# Patient Record
Sex: Female | Born: 1955 | Race: White | Hispanic: No | Marital: Married | Smoking: Never smoker
Health system: Southern US, Community
[De-identification: ages and names within clinical notes are randomized; demographics above are authoritative.]

## PROBLEM LIST (undated history)

## (undated) DIAGNOSIS — E785 Hyperlipidemia, unspecified: Secondary | ICD-10-CM

## (undated) DIAGNOSIS — C4492 Squamous cell carcinoma of skin, unspecified: Secondary | ICD-10-CM

## (undated) DIAGNOSIS — M810 Age-related osteoporosis without current pathological fracture: Secondary | ICD-10-CM

## (undated) HISTORY — DX: Hyperlipidemia, unspecified: E78.5

## (undated) HISTORY — DX: Age-related osteoporosis without current pathological fracture: M81.0

## (undated) HISTORY — PX: LAPAROSCOPY: SHX197

---
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
## 1982-05-11 DIAGNOSIS — F32A Depression, unspecified: Secondary | ICD-10-CM

## 1982-05-11 DIAGNOSIS — F419 Anxiety disorder, unspecified: Secondary | ICD-10-CM

## 1982-05-11 HISTORY — DX: Anxiety disorder, unspecified: F41.9

## 1982-05-11 HISTORY — DX: Depression, unspecified: F32.A

## 1983-09-20 DIAGNOSIS — J45909 Unspecified asthma, uncomplicated: Secondary | ICD-10-CM

## 1983-09-20 HISTORY — DX: Unspecified asthma, uncomplicated: J45.909

## 2004-09-18 ENCOUNTER — Ambulatory Visit: Payer: Self-pay | Admitting: Unknown Physician Specialty

## 2005-07-22 ENCOUNTER — Ambulatory Visit: Payer: Self-pay | Admitting: Unknown Physician Specialty

## 2006-08-02 ENCOUNTER — Ambulatory Visit: Payer: Self-pay | Admitting: Unknown Physician Specialty

## 2006-10-11 HISTORY — PX: BREAST BIOPSY: SHX20

## 2007-07-14 ENCOUNTER — Ambulatory Visit: Payer: Self-pay | Admitting: Unknown Physician Specialty

## 2007-09-27 ENCOUNTER — Ambulatory Visit: Payer: Self-pay | Admitting: Unknown Physician Specialty

## 2008-11-05 ENCOUNTER — Ambulatory Visit: Payer: Self-pay | Admitting: Unknown Physician Specialty

## 2008-11-14 ENCOUNTER — Ambulatory Visit: Payer: Self-pay | Admitting: Unknown Physician Specialty

## 2009-11-19 ENCOUNTER — Ambulatory Visit: Payer: Self-pay | Admitting: Unknown Physician Specialty

## 2010-08-19 ENCOUNTER — Ambulatory Visit: Payer: Self-pay | Admitting: Unknown Physician Specialty

## 2010-11-26 ENCOUNTER — Ambulatory Visit: Payer: Self-pay | Admitting: Unknown Physician Specialty

## 2011-05-12 ENCOUNTER — Ambulatory Visit: Payer: Self-pay | Admitting: Unknown Physician Specialty

## 2011-11-30 ENCOUNTER — Ambulatory Visit: Payer: Self-pay | Admitting: Internal Medicine

## 2012-11-30 ENCOUNTER — Ambulatory Visit: Payer: Self-pay | Admitting: Internal Medicine

## 2013-05-09 ENCOUNTER — Ambulatory Visit: Payer: Self-pay | Admitting: Family Medicine

## 2013-12-04 ENCOUNTER — Ambulatory Visit: Payer: Self-pay | Admitting: Internal Medicine

## 2014-04-18 ENCOUNTER — Ambulatory Visit: Payer: Self-pay | Admitting: Emergency Medicine

## 2014-08-11 HISTORY — PX: COLONOSCOPY: SHX174

## 2014-08-11 LAB — HM COLONOSCOPY

## 2014-09-04 ENCOUNTER — Ambulatory Visit: Payer: Self-pay | Admitting: Unknown Physician Specialty

## 2014-11-18 LAB — CBC AND DIFFERENTIAL: Hemoglobin: 15 g/dL (ref 12.0–16.0)

## 2014-11-18 LAB — TSH: TSH: 1.05 u[IU]/mL (ref <=5.90)

## 2014-11-18 LAB — LIPID PANEL
Cholesterol: 237 mg/dL — AB (ref 0–200)
HDL: 74 mg/dL — AB (ref 35–70)
LDL Cholesterol: 139 mg/dL
Triglycerides: 121 mg/dL (ref 40–160)

## 2014-11-18 LAB — BASIC METABOLIC PANEL
BUN: 12 mg/dL (ref 4–21)
Creatinine: 0.9 mg/dL (ref <=1.1)

## 2014-11-18 LAB — HM PAP SMEAR: HM Pap smear: NEGATIVE

## 2014-12-06 ENCOUNTER — Ambulatory Visit: Payer: Self-pay | Admitting: Internal Medicine

## 2014-12-10 ENCOUNTER — Ambulatory Visit: Payer: Self-pay | Admitting: Internal Medicine

## 2014-12-10 LAB — HM MAMMOGRAPHY

## 2015-02-03 LAB — SURGICAL PATHOLOGY

## 2015-08-11 ENCOUNTER — Encounter: Payer: Self-pay | Admitting: Internal Medicine

## 2015-08-11 ENCOUNTER — Other Ambulatory Visit: Payer: Self-pay | Admitting: Internal Medicine

## 2015-08-11 DIAGNOSIS — M810 Age-related osteoporosis without current pathological fracture: Secondary | ICD-10-CM | POA: Insufficient documentation

## 2015-08-11 DIAGNOSIS — F5101 Primary insomnia: Secondary | ICD-10-CM | POA: Insufficient documentation

## 2015-08-11 DIAGNOSIS — F324 Major depressive disorder, single episode, in partial remission: Secondary | ICD-10-CM | POA: Insufficient documentation

## 2015-08-11 DIAGNOSIS — F339 Major depressive disorder, recurrent, unspecified: Secondary | ICD-10-CM | POA: Insufficient documentation

## 2015-08-11 DIAGNOSIS — E559 Vitamin D deficiency, unspecified: Secondary | ICD-10-CM | POA: Insufficient documentation

## 2015-08-11 DIAGNOSIS — A6000 Herpesviral infection of urogenital system, unspecified: Secondary | ICD-10-CM | POA: Insufficient documentation

## 2015-08-11 DIAGNOSIS — J4520 Mild intermittent asthma, uncomplicated: Secondary | ICD-10-CM | POA: Insufficient documentation

## 2015-08-11 DIAGNOSIS — T7840XA Allergy, unspecified, initial encounter: Secondary | ICD-10-CM | POA: Insufficient documentation

## 2015-09-26 ENCOUNTER — Other Ambulatory Visit: Payer: Self-pay

## 2015-09-26 MED ORDER — FLUOXETINE HCL 40 MG PO CAPS: 40.0000 mg | ORAL_CAPSULE | Freq: Every day | ORAL | Status: DC

## 2015-10-10 NOTE — Telephone Encounter (Signed)
pts coming in on 3/29 for cpe

## 2015-11-19 ENCOUNTER — Other Ambulatory Visit: Payer: Self-pay | Admitting: Internal Medicine

## 2015-11-24 ENCOUNTER — Other Ambulatory Visit: Payer: Self-pay | Admitting: Internal Medicine

## 2016-01-07 ENCOUNTER — Encounter: Payer: Self-pay | Admitting: Internal Medicine

## 2016-01-07 ENCOUNTER — Ambulatory Visit (INDEPENDENT_AMBULATORY_CARE_PROVIDER_SITE_OTHER): Payer: 59 | Admitting: Internal Medicine

## 2016-01-07 VITALS — BP 110/72 | HR 80 | Ht 64.0 in | Wt 145.0 lb

## 2016-01-07 DIAGNOSIS — Z1239 Encounter for other screening for malignant neoplasm of breast: Secondary | ICD-10-CM | POA: Diagnosis not present

## 2016-01-07 DIAGNOSIS — Z0000 Encounter for general adult medical examination without abnormal findings: Secondary | ICD-10-CM | POA: Diagnosis not present

## 2016-01-07 DIAGNOSIS — J4520 Mild intermittent asthma, uncomplicated: Secondary | ICD-10-CM

## 2016-01-07 DIAGNOSIS — F5101 Primary insomnia: Secondary | ICD-10-CM

## 2016-01-07 DIAGNOSIS — M810 Age-related osteoporosis without current pathological fracture: Secondary | ICD-10-CM

## 2016-01-07 DIAGNOSIS — F339 Major depressive disorder, recurrent, unspecified: Secondary | ICD-10-CM

## 2016-01-07 LAB — POC URINALYSIS WITH MICROSCOPIC (NON AUTO)MANUAL RESULT
Bilirubin, UA: NEGATIVE
Blood, UA: NEGATIVE
Crystals: 0
Epithelial cells, urine per micros: 5
Glucose, UA: NEGATIVE
Ketones, UA: NEGATIVE
Mucus, UA: 0
Nitrite, UA: NEGATIVE
Protein, UA: NEGATIVE
RBC: 0 M/uL — AB (ref 4.04–5.48)
Spec Grav, UA: 1.025
Urobilinogen, UA: 0.2
WBC Casts, UA: 0
pH, UA: 6.5

## 2016-01-07 NOTE — Patient Instructions (Signed)
Breast Self-Awareness Practicing breast self-awareness may pick up problems early, prevent significant medical complications, and possibly save your life. By practicing breast self-awareness, you can become familiar with how your breasts look and feel and if your breasts are changing. This allows you to notice changes early. It can also offer you some reassurance that your breast health is good. One way to learn what is normal for your breasts and whether your breasts are changing is to do a breast self-exam. If you find a lump or something that was not present in the past, it is best to contact your caregiver right away. Other findings that should be evaluated by your caregiver include nipple discharge, especially if it is bloody; skin changes or reddening; areas where the skin seems to be pulled in (retracted); or new lumps and bumps. Breast pain is seldom associated with cancer (malignancy), but should also be evaluated by a caregiver. HOW TO PERFORM A BREAST SELF-EXAM The best time to examine your breasts is 5-7 days after your menstrual period is over. During menstruation, the breasts are lumpier, and it may be more difficult to pick up changes. If you do not menstruate, have reached menopause, or had your uterus removed (hysterectomy), you should examine your breasts at regular intervals, such as monthly. If you are breastfeeding, examine your breasts after a feeding or after using a breast pump. Breast implants do not decrease the risk for lumps or tumors, so continue to perform breast self-exams as recommended. Talk to your caregiver about how to determine the difference between the implant and breast tissue. Also, talk about the amount of pressure you should use during the exam. Over time, you will become more familiar with the variations of your breasts and more comfortable with the exam. A breast self-exam requires you to remove all your clothes above the waist. 1. Look at your breasts and nipples.  Stand in front of a mirror in a room with good lighting. With your hands on your hips, push your hands firmly downward. Look for a difference in shape, contour, and size from one breast to the other (asymmetry). Asymmetry includes puckers, dips, or bumps. Also, look for skin changes, such as reddened or scaly areas on the breasts. Look for nipple changes, such as discharge, dimpling, repositioning, or redness. 2. Carefully feel your breasts. This is best done either in the shower or tub while using soapy water or when flat on your back. Place the arm (on the side of the breast you are examining) above your head. Use the pads (not the fingertips) of your three middle fingers on your opposite hand to feel your breasts. Start in the underarm area and use  inch (2 cm) overlapping circles to feel your breast. Use 3 different levels of pressure (light, medium, and firm pressure) at each circle before moving to the next circle. The light pressure is needed to feel the tissue closest to the skin. The medium pressure will help to feel breast tissue a little deeper, while the firm pressure is needed to feel the tissue close to the ribs. Continue the overlapping circles, moving downward over the breast until you feel your ribs below your breast. Then, move one finger-width towards the center of the body. Continue to use the  inch (2 cm) overlapping circles to feel your breast as you move slowly up toward the collar bone (clavicle) near the base of the neck. Continue the up and down exam using all 3 pressures until you reach the   middle of the chest. Do this with each breast, carefully feeling for lumps or changes. 3.  Keep a written record with breast changes or normal findings for each breast. By writing this information down, you do not need to depend only on memory for size, tenderness, or location. Write down where you are in your menstrual cycle, if you are still menstruating. Breast tissue can have some lumps or  thick tissue. However, see your caregiver if you find anything that concerns you.  SEEK MEDICAL CARE IF:  You see a change in shape, contour, or size of your breasts or nipples.   You see skin changes, such as reddened or scaly areas on the breasts or nipples.   You have an unusual discharge from your nipples.   You feel a new lump or unusually thick areas.    This information is not intended to replace advice given to you by your health care provider. Make sure you discuss any questions you have with your health care provider.   Document Released: 09/27/2005 Document Revised: 09/13/2012 Document Reviewed: 01/12/2012 Elsevier Interactive Patient Education 2016 [employer]  

## 2016-01-07 NOTE — Progress Notes (Signed)
Date:  01/07/2016   Name:  REIGHLYNN DEFREECE   DOB:  23-Oct-1955   MRN:  [id number]   Chief Complaint: Annual Exam Sherlynn Stalls Reola Mosher is a 60 y.o. female who presents today for her Complete Annual Exam. She feels well. She reports exercising none. She reports she is sleeping well. She is still working full time and handles all of the household chores since her husband has Parkinson's disease and mild dementia.    Asthma She complains of chest tightness and wheezing. There is no shortness of breath. Chronicity: very rare symptoms - uses albuterol less than once per month. The current episode started more than 1 year ago. The problem occurs rarely. The problem has been unchanged. Pertinent negatives include no chest pain, fever, headaches or trouble swallowing. Her symptoms are aggravated by animal exposure and pollen. Her symptoms are not alleviated by beta-agonist.  Depression        This is a chronic problem.  The current episode started more than 1 year ago.   The problem occurs intermittently.  The problem has been resolved since onset.  Associated symptoms include no decreased concentration, no fatigue and no headaches.  Past treatments include SSRIs - Selective serotonin reuptake inhibitors.  Compliance with treatment is good.  Previous treatment provided significant relief.    Review of Systems  Constitutional: Negative for fever, chills and fatigue.  HENT: Negative for hearing loss and trouble swallowing.   Eyes: Negative for visual disturbance.  Respiratory: Positive for wheezing. Negative for chest tightness and shortness of breath.   Cardiovascular: Negative for chest pain, palpitations and leg swelling.  Gastrointestinal: Negative for nausea, vomiting, abdominal pain, diarrhea and blood in stool.  Genitourinary: Negative for dysuria, urgency, hematuria, vaginal bleeding and vaginal discharge.  Musculoskeletal: Negative for joint swelling and arthralgias.  Skin: Negative for rash.    Neurological: Negative for dizziness, tremors, syncope, weakness and headaches.  Hematological: Negative for adenopathy.  Psychiatric/Behavioral: Positive for depression. Negative for sleep disturbance, dysphoric mood and decreased concentration.    Patient Active Problem List   Diagnosis Date Noted  . Asthma, mild intermittent 08/11/2015  . Allergic state 08/11/2015  . Genital herpes simplex type 2 08/11/2015  . Chronic recurrent major depressive disorder ([place]) 08/11/2015  . OP (osteoporosis) 08/11/2015  . Idiopathic insomnia 08/11/2015  . Avitaminosis D 08/11/2015    Prior to Admission medications   Medication Sig Start Date End Date Taking? Authorizing Provider  acyclovir ointment (ZOVIRAX) 5 % ZOVIRAX, 5% (External Ointment)  1 Ointment PRN for 30 days  Quantity: 15;  Refills: 2   Ordered :18-Nov-2014  Halina Maidens M.D.;  Started 18-Nov-2014 Active 11/18/14  Yes Historical Provider, MD  cetirizine (ZYRTEC) 10 MG tablet Take 10 mg by mouth daily.   Yes Historical Provider, MD  FLUoxetine (PROZAC) 40 MG capsule Take 1 capsule (40 mg total) by mouth daily. 09/26/15  Yes Glean Hess, MD  PROAIR HFA 108 [phone] Base) MCG/ACT inhaler INHALE TWO PUFFS FOUR TIMES DAILY 11/24/15  Yes Glean Hess, MD  traZODone (DESYREL) 50 MG tablet TAKE ONE (1) TABLET BY MOUTH EVERY DAY 11/19/15  Yes Glean Hess, MD    No Known Allergies  Past Surgical History  Procedure Laterality Date  . Laparoscopy      endometriosis    Social History  Substance Use Topics  . Smoking status: Never Smoker   . Smokeless tobacco: None  . Alcohol Use: 1.2 oz/week    2 Standard drinks  or equivalent per week     Comment: occasional    Medication list has been reviewed and updated.   Physical Exam  Constitutional: She is oriented to person, place, and time. She appears well-developed and well-nourished. No distress.  HENT:  Head: Normocephalic and atraumatic.  Right Ear: Tympanic membrane and  ear canal normal.  Left Ear: Tympanic membrane and ear canal normal.  Nose: Right sinus exhibits no maxillary sinus tenderness. Left sinus exhibits no maxillary sinus tenderness.  Mouth/Throat: Uvula is midline and oropharynx is clear and moist.  Eyes: Conjunctivae and EOM are normal. Right eye exhibits no discharge. Left eye exhibits no discharge. No scleral icterus.  Neck: Normal range of motion. Carotid bruit is not present. No erythema present. No thyromegaly present.  Cardiovascular: Normal rate, regular rhythm, normal heart sounds and normal pulses.   Pulmonary/Chest: Effort normal. No respiratory distress. She has no wheezes. Right breast exhibits no mass, no nipple discharge, no skin change and no tenderness. Left breast exhibits no mass, no nipple discharge, no skin change and no tenderness.  Abdominal: Soft. Bowel sounds are normal. There is no hepatosplenomegaly. There is no tenderness. There is no CVA tenderness.  Musculoskeletal: Normal range of motion.  Lymphadenopathy:    She has no cervical adenopathy.    She has no axillary adenopathy.  Neurological: She is alert and oriented to person, place, and time. She has normal reflexes. No cranial nerve deficit or sensory deficit.  Skin: Skin is warm, dry and intact. No rash noted.  Psychiatric: She has a normal mood and affect. Her speech is normal and behavior is normal. Thought content normal.  Nursing note and vitals reviewed.   BP 110/72 mmHg  Pulse 80  Ht 5\' 4"  (1.626 m)  Wt 145 lb (65.772 kg)  BMI 24.88 kg/m2  Assessment and Plan: 1. Annual physical exam Encourage regular exercise for stress reduction and bone health - CBC with Differential/Platelet - Comprehensive metabolic panel - Lipid panel  2. Chronic recurrent major depressive disorder ([place]) Doing well on medication - TSH  3. Asthma, mild intermittent, uncomplicated Recommend [id number] [none available today] but will give next visit  4. OP  (osteoporosis) Begin regular exercise and take calcium +D daily - DG Bone Density; Future  5. Idiopathic insomnia Controlled with trazodone  6. Breast cancer screening - MM DIGITAL SCREENING BILATERAL; Future   Halina Maidens, MD [facility]  01/07/2016

## 2016-01-08 ENCOUNTER — Telehealth: Payer: Self-pay

## 2016-01-08 LAB — COMPREHENSIVE METABOLIC PANEL
ALT: 30 IU/L (ref 0–32)
AST: 29 IU/L (ref 0–40)
Albumin/Globulin Ratio: 2.2 (ref 1.2–2.2)
Albumin: 4.8 g/dL (ref 3.6–4.8)
Alkaline Phosphatase: 56 IU/L (ref 39–117)
BUN/Creatinine Ratio: 13 (ref 11–26)
BUN: 11 mg/dL (ref 8–27)
Bilirubin Total: 0.4 mg/dL (ref 0.0–1.2)
CO2: 23 mmol/L (ref 18–29)
Calcium: 9.2 mg/dL (ref 8.7–10.3)
Chloride: 100 mmol/L (ref 96–106)
Creatinine, Ser: 0.87 mg/dL (ref 0.57–1.00)
GFR calc Af Amer: 83 mL/min/{1.73_m2} (ref >59)
GFR calc non Af Amer: 72 mL/min/{1.73_m2} (ref >59)
Globulin, Total: 2.2 g/dL (ref 1.5–4.5)
Glucose: 76 mg/dL (ref 65–99)
Potassium: 4.5 mmol/L (ref 3.5–5.2)
Sodium: 142 mmol/L (ref 134–144)
Total Protein: 7 g/dL (ref 6.0–8.5)

## 2016-01-08 LAB — LIPID PANEL
Chol/HDL Ratio: 3.4 ratio units (ref 0.0–4.4)
Cholesterol, Total: 238 mg/dL — ABNORMAL HIGH (ref 100–199)
HDL: 70 mg/dL (ref >39)
LDL Calculated: 141 mg/dL — ABNORMAL HIGH (ref 0–99)
Triglycerides: 136 mg/dL (ref 0–149)
VLDL Cholesterol Cal: 27 mg/dL (ref 5–40)

## 2016-01-08 LAB — CBC WITH DIFFERENTIAL/PLATELET
Basophils Absolute: 0 10*3/uL (ref 0.0–0.2)
Basos: 0 %
EOS (ABSOLUTE): 0.1 10*3/uL (ref 0.0–0.4)
Eos: 2 %
Hematocrit: 43.7 % (ref 34.0–46.6)
Hemoglobin: 14.7 g/dL (ref 11.1–15.9)
Immature Grans (Abs): 0 10*3/uL (ref 0.0–0.1)
Immature Granulocytes: 0 %
Lymphocytes Absolute: 1.9 10*3/uL (ref 0.7–3.1)
Lymphs: 29 %
MCH: 29.5 pg (ref 26.6–33.0)
MCHC: 33.6 g/dL (ref 31.5–35.7)
MCV: 88 fL (ref 79–97)
Monocytes Absolute: 0.5 10*3/uL (ref 0.1–0.9)
Monocytes: 8 %
Neutrophils Absolute: 3.9 10*3/uL (ref 1.4–7.0)
Neutrophils: 61 %
Platelets: 282 10*3/uL (ref 150–379)
RBC: 4.99 x10E6/uL (ref 3.77–5.28)
RDW: 14.4 % (ref 12.3–15.4)
WBC: 6.4 10*3/uL (ref 3.4–10.8)

## 2016-01-08 LAB — TSH: TSH: 0.986 u[IU]/mL (ref 0.450–4.500)

## 2016-01-08 NOTE — Telephone Encounter (Signed)
Spoke with patient. Patient advised of all results and verbalized understanding. Will call back with any future questions or concerns. MAH  

## 2016-01-08 NOTE — Telephone Encounter (Signed)
-----   Message from Glean Hess, MD sent at 01/08/2016  7:49 AM EDT ----- Labs are normal.

## 2016-01-08 NOTE — Telephone Encounter (Signed)
Left message for patient to call back  

## 2016-01-27 ENCOUNTER — Ambulatory Visit
Admission: RE | Admit: 2016-01-27 | Discharge: 2016-01-27 | Disposition: A | Discharge status: Home / Self Care | Payer: 59 | Source: Ambulatory Visit | Attending: Internal Medicine | Admitting: Internal Medicine

## 2016-01-27 ENCOUNTER — Telehealth: Payer: Self-pay

## 2016-01-27 DIAGNOSIS — M8580 Other specified disorders of bone density and structure, unspecified site: Secondary | ICD-10-CM | POA: Diagnosis not present

## 2016-01-27 DIAGNOSIS — Z1231 Encounter for screening mammogram for malignant neoplasm of breast: Secondary | ICD-10-CM | POA: Diagnosis present

## 2016-01-27 DIAGNOSIS — M810 Age-related osteoporosis without current pathological fracture: Secondary | ICD-10-CM

## 2016-01-27 DIAGNOSIS — Z1239 Encounter for other screening for malignant neoplasm of breast: Secondary | ICD-10-CM

## 2016-01-27 NOTE — Telephone Encounter (Signed)
-----   Message from Glean Hess, MD sent at 01/27/2016 10:14 AM EDT ----- Bone density shows normal spine (unchanged from 2015).  Hip is osteopenia - slightly worse from 2015 but still not osteoporosis.  Take Calcium 1200 mg per day and Vitamin D 800 IU per day and daily weight bearing exercise.

## 2016-01-27 NOTE — Telephone Encounter (Signed)
Left message for patient to call back  

## 2016-01-29 NOTE — Telephone Encounter (Signed)
Spoke with patient. Patient advised of all results and verbalized understanding. Will call back with any future questions or concerns. MAH  

## 2016-02-19 ENCOUNTER — Other Ambulatory Visit: Payer: Self-pay | Admitting: Internal Medicine

## 2016-05-18 ENCOUNTER — Other Ambulatory Visit: Payer: Self-pay | Admitting: Internal Medicine

## 2016-11-01 DIAGNOSIS — D2312 Other benign neoplasm of skin of left eyelid, including canthus: Secondary | ICD-10-CM | POA: Diagnosis not present

## 2016-11-02 DIAGNOSIS — Z86018 Personal history of other benign neoplasm: Secondary | ICD-10-CM | POA: Diagnosis not present

## 2016-11-02 DIAGNOSIS — L821 Other seborrheic keratosis: Secondary | ICD-10-CM | POA: Diagnosis not present

## 2016-11-12 ENCOUNTER — Other Ambulatory Visit: Payer: Self-pay | Admitting: Internal Medicine

## 2016-11-15 NOTE — Telephone Encounter (Signed)
pts coming in on 6/25 for cpe

## 2016-11-25 DIAGNOSIS — H029 Unspecified disorder of eyelid: Secondary | ICD-10-CM | POA: Diagnosis not present

## 2016-12-21 ENCOUNTER — Other Ambulatory Visit: Payer: Self-pay | Admitting: Internal Medicine

## 2016-12-21 DIAGNOSIS — Z1231 Encounter for screening mammogram for malignant neoplasm of breast: Secondary | ICD-10-CM

## 2016-12-23 DIAGNOSIS — D2312 Other benign neoplasm of skin of left eyelid, including canthus: Secondary | ICD-10-CM | POA: Diagnosis not present

## 2016-12-23 DIAGNOSIS — H029 Unspecified disorder of eyelid: Secondary | ICD-10-CM | POA: Diagnosis not present

## 2017-01-27 ENCOUNTER — Ambulatory Visit
Admission: RE | Admit: 2017-01-27 | Discharge: 2017-01-27 | Disposition: A | Discharge status: Home / Self Care | Payer: 59 | Source: Ambulatory Visit | Attending: Internal Medicine | Admitting: Internal Medicine

## 2017-01-27 DIAGNOSIS — Z1231 Encounter for screening mammogram for malignant neoplasm of breast: Secondary | ICD-10-CM | POA: Diagnosis not present

## 2017-01-28 ENCOUNTER — Other Ambulatory Visit: Payer: Self-pay

## 2017-01-28 MED ORDER — FLUOXETINE HCL 40 MG PO CAPS: 40.0000 mg | ORAL_CAPSULE | Freq: Every day | ORAL | 3 refills | Status: DC

## 2017-01-31 ENCOUNTER — Other Ambulatory Visit: Payer: Self-pay | Admitting: Internal Medicine

## 2017-01-31 MED ORDER — FLUOXETINE HCL 40 MG PO CAPS: 40.0000 mg | ORAL_CAPSULE | Freq: Every day | ORAL | 3 refills | Status: DC

## 2017-04-04 ENCOUNTER — Ambulatory Visit (INDEPENDENT_AMBULATORY_CARE_PROVIDER_SITE_OTHER): Payer: 59 | Admitting: Internal Medicine

## 2017-04-04 ENCOUNTER — Encounter: Payer: Self-pay | Admitting: Internal Medicine

## 2017-04-04 VITALS — BP 122/66 | HR 76 | Ht 64.0 in | Wt 151.0 lb

## 2017-04-04 DIAGNOSIS — F339 Major depressive disorder, recurrent, unspecified: Secondary | ICD-10-CM

## 2017-04-04 DIAGNOSIS — J4520 Mild intermittent asthma, uncomplicated: Secondary | ICD-10-CM | POA: Diagnosis not present

## 2017-04-04 DIAGNOSIS — Z0000 Encounter for general adult medical examination without abnormal findings: Secondary | ICD-10-CM

## 2017-04-04 DIAGNOSIS — F5101 Primary insomnia: Secondary | ICD-10-CM

## 2017-04-04 LAB — POCT URINALYSIS DIPSTICK
Bilirubin, UA: NEGATIVE
Blood, UA: NEGATIVE
Glucose, UA: NEGATIVE
Ketones, UA: NEGATIVE
Leukocytes, UA: NEGATIVE
Nitrite, UA: NEGATIVE
Protein, UA: NEGATIVE
Spec Grav, UA: 1.015 (ref 1.010–1.025)
Urobilinogen, UA: 0.2 E.U./dL
pH, UA: 5 (ref 5.0–8.0)

## 2017-04-04 MED ORDER — TRAZODONE HCL 50 MG PO TABS: 50.0000 mg | ORAL_TABLET | Freq: Every day | ORAL | 1 refills | Status: DC

## 2017-04-04 NOTE — Progress Notes (Signed)
Date:  04/04/2017   Name:  Janet Cook   DOB:  Dec 09, 1955   MRN:  [id number]   Chief Complaint: Annual Exam (Breast Exam. No pap- last was 2016 with Neg HPV) Janet Cook is a 61 y.o. female who presents today for her Complete Annual Exam. She feels fairly well. She reports exercising some. She reports she is sleeping fairly well taking Trazodone nightly. Last pap done in 2016 - normal with negative HPV.  Mammogram done in April.  Her husband's parkinsonism is getting worse - she is still working for now but there are times when she gets little sleep. Her mood is stable on Prozac. She is exercising some but has gained about 5 lbs.   Review of Systems  Constitutional: Negative for chills, fatigue and fever.  HENT: Negative for congestion, hearing loss, tinnitus, trouble swallowing and voice change.   Eyes: Negative for visual disturbance.  Respiratory: Negative for cough, chest tightness, shortness of breath and wheezing.   Cardiovascular: Negative for chest pain, palpitations and leg swelling.  Gastrointestinal: Negative for abdominal pain, constipation, diarrhea and vomiting.  Endocrine: Negative for polydipsia and polyuria.  Genitourinary: Negative for dysuria, frequency, genital sores, vaginal bleeding and vaginal discharge.  Musculoskeletal: Negative for arthralgias, gait problem and joint swelling.  Skin: Negative for color change and rash.  Neurological: Negative for dizziness, tremors, light-headedness and headaches.  Hematological: Negative for adenopathy. Does not bruise/bleed easily.  Psychiatric/Behavioral: Negative for dysphoric mood and sleep disturbance. The patient is not nervous/anxious.     Patient Active Problem List   Diagnosis Date Noted  . Asthma, mild intermittent 08/11/2015  . Allergic state 08/11/2015  . Genital herpes simplex type 2 08/11/2015  . Chronic recurrent major depressive disorder ([place]) 08/11/2015  . OP (osteoporosis) 08/11/2015  .  Idiopathic insomnia 08/11/2015  . Avitaminosis D 08/11/2015    Prior to Admission medications   Medication Sig Start Date End Date Taking? Authorizing Provider  acyclovir ointment (ZOVIRAX) 5 % APPLY TO AFFECTED AREA AS NEEDED 05/18/16  Yes Glean Hess, MD  cetirizine (ZYRTEC) 10 MG tablet Take 10 mg by mouth daily.   Yes [provider]  FLUoxetine (PROZAC) 40 MG capsule Take 1 capsule (40 mg total) by mouth daily. 01/31/17  Yes Glean Hess, MD  PROAIR HFA 108 [phone] Base) MCG/ACT inhaler INHALE TWO PUFFS FOUR TIMES DAILY 11/24/15  Yes Glean Hess, MD  traZODone (DESYREL) 50 MG tablet TAKE ONE (1) TABLET BY MOUTH EVERY DAY 11/13/16  Yes Glean Hess, MD    No Known Allergies  Past Surgical History:  Procedure Laterality Date  . BREAST BIOPSY Right 2008   CORE - NEG  . LAPAROSCOPY     endometriosis    Social History  Substance Use Topics  . Smoking status: Never Smoker  . Smokeless tobacco: Never Used  . Alcohol use 1.2 oz/week    2 Standard drinks or equivalent per week     Comment: occasional   Depression screen [facility] 2/9 04/04/2017  Decreased Interest 1  Down, Depressed, Hopeless 1  PHQ - 2 Score 2  Altered sleeping 0  Tired, decreased energy 0  Change in appetite 0  Feeling bad or failure about yourself  0  Trouble concentrating 0  Moving slowly or fidgety/restless 0  Suicidal thoughts 0  PHQ-9 Score 2  Difficult doing work/chores Somewhat difficult     Medication list has been reviewed and updated.   Physical Exam  Constitutional:  She is oriented to person, place, and time. She appears well-developed and well-nourished. No distress.  HENT:  Head: Normocephalic and atraumatic.  Right Ear: Tympanic membrane and ear canal normal.  Left Ear: Tympanic membrane and ear canal normal.  Nose: Right sinus exhibits no maxillary sinus tenderness. Left sinus exhibits no maxillary sinus tenderness.  Mouth/Throat: Uvula is midline and oropharynx is  clear and moist.  Eyes: Conjunctivae and EOM are normal. Right eye exhibits no discharge. Left eye exhibits no discharge. No scleral icterus.  Neck: Normal range of motion. Carotid bruit is not present. No erythema present. No thyromegaly present.  Cardiovascular: Normal rate, regular rhythm, normal heart sounds and normal pulses.   Pulmonary/Chest: Effort normal. No respiratory distress. She has no wheezes. Right breast exhibits no mass, no nipple discharge, no skin change and no tenderness. Left breast exhibits no mass, no nipple discharge, no skin change and no tenderness.  Abdominal: Soft. Bowel sounds are normal. There is no hepatosplenomegaly. There is no tenderness. There is no CVA tenderness.  Musculoskeletal: Normal range of motion.  Lymphadenopathy:    She has no cervical adenopathy.    She has no axillary adenopathy.  Neurological: She is alert and oriented to person, place, and time. She has normal reflexes. No cranial nerve deficit or sensory deficit.  Skin: Skin is warm, dry and intact. No rash noted.  Psychiatric: She has a normal mood and affect. Her speech is normal and behavior is normal. Thought content normal.  Nursing note and vitals reviewed.   BP 122/66   Pulse 76   Ht 5\' 4"  (1.626 m)   Wt 151 lb (68.5 kg)   SpO2 99%   BMI 25.92 kg/m   Assessment and Plan: 1. Annual physical exam Continue exercise and healthy diet Mammogram and colonoscopy are up to date - CBC with Differential/Platelet - Comprehensive metabolic panel - Lipid panel - TSH - POCT urinalysis dipstick  2. Mild intermittent asthma without complication stable  3. Chronic recurrent major depressive disorder ([place]) Doing fairly well on Prozac  4. Idiopathic insomnia Trazodone QHS   Meds ordered this encounter  Medications  . traZODone (DESYREL) 50 MG tablet    Sig: Take 1 tablet (50 mg total) by mouth at bedtime.    Dispense:  90 tablet    Refill:  [place], MD [facility]  04/04/2017

## 2017-04-04 NOTE — Patient Instructions (Signed)

## 2017-04-05 LAB — CBC WITH DIFFERENTIAL/PLATELET
Basophils Absolute: 0 10*3/uL (ref 0.0–0.2)
Basos: 1 %
EOS (ABSOLUTE): 0.2 10*3/uL (ref 0.0–0.4)
Eos: 3 %
Hematocrit: 43.7 % (ref 34.0–46.6)
Hemoglobin: 14.6 g/dL (ref 11.1–15.9)
Immature Grans (Abs): 0 10*3/uL (ref 0.0–0.1)
Immature Granulocytes: 0 %
Lymphocytes Absolute: 2 10*3/uL (ref 0.7–3.1)
Lymphs: 30 %
MCH: 30 pg (ref 26.6–33.0)
MCHC: 33.4 g/dL (ref 31.5–35.7)
MCV: 90 fL (ref 79–97)
Monocytes Absolute: 0.6 10*3/uL (ref 0.1–0.9)
Monocytes: 9 %
Neutrophils Absolute: 3.8 10*3/uL (ref 1.4–7.0)
Neutrophils: 57 %
Platelets: 283 10*3/uL (ref 150–379)
RBC: 4.87 x10E6/uL (ref 3.77–5.28)
RDW: 13.9 % (ref 12.3–15.4)
WBC: 6.6 10*3/uL (ref 3.4–10.8)

## 2017-04-05 LAB — COMPREHENSIVE METABOLIC PANEL
ALT: 23 IU/L (ref 0–32)
AST: 21 IU/L (ref 0–40)
Albumin/Globulin Ratio: 2.4 — ABNORMAL HIGH (ref 1.2–2.2)
Albumin: 4.7 g/dL (ref 3.6–4.8)
Alkaline Phosphatase: 57 IU/L (ref 39–117)
BUN/Creatinine Ratio: 14 (ref 12–28)
BUN: 15 mg/dL (ref 8–27)
Bilirubin Total: 0.4 mg/dL (ref 0.0–1.2)
CO2: 23 mmol/L (ref 20–29)
Calcium: 9.6 mg/dL (ref 8.7–10.3)
Chloride: 102 mmol/L (ref 96–106)
Creatinine, Ser: 1.05 mg/dL — ABNORMAL HIGH (ref 0.57–1.00)
GFR calc Af Amer: 65 mL/min/{1.73_m2} (ref >59)
GFR calc non Af Amer: 57 mL/min/{1.73_m2} — ABNORMAL LOW (ref >59)
Globulin, Total: 2 g/dL (ref 1.5–4.5)
Glucose: 73 mg/dL (ref 65–99)
Potassium: 4.3 mmol/L (ref 3.5–5.2)
Sodium: 142 mmol/L (ref 134–144)
Total Protein: 6.7 g/dL (ref 6.0–8.5)

## 2017-04-05 LAB — TSH: TSH: 1.25 u[IU]/mL (ref 0.450–4.500)

## 2017-04-05 LAB — LIPID PANEL
Chol/HDL Ratio: 3.1 ratio (ref 0.0–4.4)
Cholesterol, Total: 213 mg/dL — ABNORMAL HIGH (ref 100–199)
HDL: 68 mg/dL (ref >39)
LDL Calculated: 115 mg/dL — ABNORMAL HIGH (ref 0–99)
Triglycerides: 152 mg/dL — ABNORMAL HIGH (ref 0–149)
VLDL Cholesterol Cal: 30 mg/dL (ref 5–40)

## 2017-07-17 DIAGNOSIS — Z23 Encounter for immunization: Secondary | ICD-10-CM | POA: Diagnosis not present

## 2017-07-21 ENCOUNTER — Telehealth: Payer: Self-pay

## 2017-07-21 MED ORDER — ACYCLOVIR 5 % EX OINT: TOPICAL_OINTMENT | CUTANEOUS | 0 refills | Status: DC

## 2017-07-21 NOTE — Telephone Encounter (Signed)
Patient called request refill for herpes outbreak. Sent in acyclovir ointment to [facility] in [place].

## 2017-07-22 ENCOUNTER — Other Ambulatory Visit: Payer: Self-pay

## 2017-07-22 MED ORDER — VALACYCLOVIR HCL 1 G PO TABS: 2000.0000 mg | ORAL_TABLET | Freq: Two times a day (BID) | ORAL | 0 refills | Status: DC

## 2017-08-22 ENCOUNTER — Other Ambulatory Visit: Payer: Self-pay | Admitting: Internal Medicine

## 2017-09-05 ENCOUNTER — Other Ambulatory Visit: Payer: Self-pay

## 2017-09-05 MED ORDER — ALBUTEROL SULFATE HFA 108 (90 BASE) MCG/ACT IN AERS: INHALATION_SPRAY | RESPIRATORY_TRACT | 5 refills | Status: DC

## 2017-09-17 ENCOUNTER — Other Ambulatory Visit: Payer: Self-pay | Admitting: Internal Medicine

## 2017-10-28 DIAGNOSIS — D225 Melanocytic nevi of trunk: Secondary | ICD-10-CM | POA: Diagnosis not present

## 2017-12-22 ENCOUNTER — Other Ambulatory Visit: Payer: Self-pay

## 2017-12-22 MED ORDER — TRAZODONE HCL 50 MG PO TABS: ORAL_TABLET | ORAL | 1 refills | Status: DC

## 2017-12-28 ENCOUNTER — Other Ambulatory Visit: Payer: Self-pay | Admitting: Internal Medicine

## 2017-12-28 DIAGNOSIS — Z1231 Encounter for screening mammogram for malignant neoplasm of breast: Secondary | ICD-10-CM

## 2018-01-13 DIAGNOSIS — M1711 Unilateral primary osteoarthritis, right knee: Secondary | ICD-10-CM | POA: Diagnosis not present

## 2018-01-30 ENCOUNTER — Ambulatory Visit
Admission: RE | Admit: 2018-01-30 | Discharge: 2018-01-30 | Disposition: A | Discharge status: Home / Self Care | Payer: 59 | Source: Ambulatory Visit | Attending: Internal Medicine | Admitting: Internal Medicine

## 2018-01-30 DIAGNOSIS — Z1231 Encounter for screening mammogram for malignant neoplasm of breast: Secondary | ICD-10-CM | POA: Insufficient documentation

## 2018-02-28 ENCOUNTER — Other Ambulatory Visit: Payer: Self-pay | Admitting: Internal Medicine

## 2018-03-23 ENCOUNTER — Ambulatory Visit: Payer: 59 | Admitting: Internal Medicine

## 2018-03-23 ENCOUNTER — Encounter: Payer: Self-pay | Admitting: Internal Medicine

## 2018-03-23 VITALS — BP 118/80 | HR 68 | Resp 16 | Ht 64.0 in | Wt 154.0 lb

## 2018-03-23 DIAGNOSIS — F339 Major depressive disorder, recurrent, unspecified: Secondary | ICD-10-CM

## 2018-03-23 DIAGNOSIS — M85859 Other specified disorders of bone density and structure, unspecified thigh: Secondary | ICD-10-CM

## 2018-03-23 DIAGNOSIS — Z0000 Encounter for general adult medical examination without abnormal findings: Secondary | ICD-10-CM | POA: Diagnosis not present

## 2018-03-23 DIAGNOSIS — J4520 Mild intermittent asthma, uncomplicated: Secondary | ICD-10-CM | POA: Diagnosis not present

## 2018-03-23 DIAGNOSIS — F5101 Primary insomnia: Secondary | ICD-10-CM

## 2018-03-23 DIAGNOSIS — Z0001 Encounter for general adult medical examination with abnormal findings: Secondary | ICD-10-CM

## 2018-03-23 DIAGNOSIS — Z23 Encounter for immunization: Secondary | ICD-10-CM | POA: Diagnosis not present

## 2018-03-23 DIAGNOSIS — Z1239 Encounter for other screening for malignant neoplasm of breast: Secondary | ICD-10-CM

## 2018-03-23 LAB — POCT URINALYSIS DIPSTICK
Bilirubin, UA: NEGATIVE
Blood, UA: NEGATIVE
Glucose, UA: NEGATIVE
Ketones, UA: NEGATIVE
Leukocytes, UA: NEGATIVE
Nitrite, UA: NEGATIVE
Protein, UA: NEGATIVE
Spec Grav, UA: 1.015 (ref 1.010–1.025)
Urobilinogen, UA: 0.2 E.U./dL
pH, UA: 5 (ref 5.0–8.0)

## 2018-03-23 MED ORDER — ZOSTER VAC RECOMB ADJUVANTED 50 MCG/0.5ML IM SUSR
0.5000 mL | Freq: Once | INTRAMUSCULAR | 1 refills | Status: AC
Start: 2018-03-23 — End: 2018-03-23

## 2018-03-23 NOTE — Progress Notes (Signed)
Date:  03/23/2018   Name:  Janet Cook   DOB:  [birth year]   MRN:  [id number]   Chief Complaint: Annual Exam Sherlynn Stalls Reola Mosher is a 62 y.o. female who presents today for her Complete Annual Exam. She feels fairly well. She reports exercising some. She reports she is sleeping fairly well. Mammogram was done in April.  She is due for Pap. She has been stressed out since her husband developed dementia and had to be put into care.  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  Asthma  There is no cough, shortness of breath or wheezing. This is a recurrent problem. The problem occurs rarely. Associated symptoms include appetite change. Pertinent negatives include no chest pain, fever, headaches or trouble swallowing. Her symptoms are alleviated by beta-agonist. She reports complete improvement on treatment. Her past medical history is significant for asthma.  Depression         This is a chronic problem.  The problem has been waxing and waning since onset.  Associated symptoms include fatigue, insomnia, decreased interest and appetite change.  Associated symptoms include no headaches and no suicidal ideas.  Past treatments include SSRIs - Selective serotonin reuptake inhibitors (also sees therapist).  Compliance with treatment is good. Insomnia  Primary symptoms: no sleep disturbance.  The onset quality is undetermined. The problem occurs every several days. Past treatments include medication. The treatment provided moderate relief. PMH includes: depression.      Review of Systems  Constitutional: Positive for appetite change and fatigue. Negative for chills and fever.  HENT: Negative for congestion, hearing loss, tinnitus, trouble swallowing and voice change.   Eyes: Negative for visual disturbance.  Respiratory: Negative for cough, chest tightness, shortness of breath and wheezing.   Cardiovascular: Negative for chest pain, palpitations and leg swelling.  Gastrointestinal: Negative for  abdominal pain, constipation, diarrhea and vomiting.  Endocrine: Negative for polydipsia and polyuria.  Genitourinary: Negative for dysuria, frequency, genital sores, vaginal bleeding and vaginal discharge.  Musculoskeletal: Negative for arthralgias, gait problem and joint swelling.  Skin: Negative for color change and rash.  Neurological: Negative for dizziness, tremors, light-headedness and headaches.  Hematological: Negative for adenopathy. Does not bruise/bleed easily.  Psychiatric/Behavioral: Positive for depression. Negative for dysphoric mood, sleep disturbance and suicidal ideas. The patient has insomnia. The patient is not nervous/anxious.     Patient Active Problem List   Diagnosis Date Noted  . Asthma, mild intermittent 08/11/2015  . Allergic state 08/11/2015  . Genital herpes simplex type 2 08/11/2015  . Chronic recurrent major depressive disorder ([place]) 08/11/2015  . OP (osteoporosis) 08/11/2015  . Idiopathic insomnia 08/11/2015  . Avitaminosis D 08/11/2015    Prior to Admission medications   Medication Sig Start Date End Date Taking? Authorizing Provider  acyclovir ointment (ZOVIRAX) 5 % APPLY TO AFFECTED AREA AS NEEDED 07/21/17   Glean Hess, MD  albuterol [facility] HFA) 108 [phone] Base) MCG/ACT inhaler INHALE TWO PUFFS FOUR TIMES DAILY AS NEEDED 09/05/17   Glean Hess, MD  cetirizine (ZYRTEC) 10 MG tablet Take 10 mg by mouth daily.    [provider]  FLUoxetine (PROZAC) 40 MG capsule Take 1 capsule (40 mg total) by mouth daily. 01/31/17   Glean Hess, MD  traZODone (DESYREL) 50 MG tablet TAKE 1 TABLET BY MOUTH EVERYDAY AT BEDTIME 12/22/17   Glean Hess, MD  valACYclovir (VALTREX) 1000 MG tablet TAKE 2 TABLETS BY MOUTH TWICE A DAY 03/01/18   Army Melia,  Jesse Sans, MD    No Known Allergies  Past Surgical History:  Procedure Laterality Date  . BREAST BIOPSY Right 2008   CORE - NEG  . COLONOSCOPY  08/11/2014  . LAPAROSCOPY     endometriosis     Social History   Tobacco Use  . Smoking status: Never Smoker  . Smokeless tobacco: Never Used  Substance Use Topics  . Alcohol use: Yes    Alcohol/week: 1.2 oz    Types: 2 Standard drinks or equivalent per week    Comment: occasional  . Drug use: No     Medication list has been reviewed and updated.  Current Meds  Medication Sig  . albuterol (PROAIR HFA) 108 (90 Base) MCG/ACT inhaler INHALE TWO PUFFS FOUR TIMES DAILY AS NEEDED  . fexofenadine (ALLEGRA) 60 MG tablet Take 60 mg by mouth 2 (two) times daily.  Marland Kitchen FLUoxetine (PROZAC) 40 MG capsule Take 1 capsule (40 mg total) by mouth daily.  . traZODone (DESYREL) 50 MG tablet TAKE 1 TABLET BY MOUTH EVERYDAY AT BEDTIME  . valACYclovir (VALTREX) 1000 MG tablet TAKE 2 TABLETS BY MOUTH TWICE A DAY  . [DISCONTINUED] acyclovir ointment (ZOVIRAX) 5 % APPLY TO AFFECTED AREA AS NEEDED    PHQ 2/9 Scores 03/23/2018 04/04/2017  PHQ - 2 Score 2 2  PHQ- 9 Score 9 2    Physical Exam  Constitutional: She is oriented to person, place, and time. She appears well-developed and well-nourished. No distress.  HENT:  Head: Normocephalic and atraumatic.  Right Ear: Tympanic membrane and ear canal normal.  Left Ear: Tympanic membrane and ear canal normal.  Nose: Right sinus exhibits no maxillary sinus tenderness. Left sinus exhibits no maxillary sinus tenderness.  Mouth/Throat: Uvula is midline and oropharynx is clear and moist.  Eyes: Conjunctivae and EOM are normal. Right eye exhibits no discharge. Left eye exhibits no discharge. No scleral icterus.  Neck: Normal range of motion. Carotid bruit is not present. No erythema present. No thyromegaly present.  Cardiovascular: Normal rate, regular rhythm, normal heart sounds and normal pulses.  Pulmonary/Chest: Effort normal. No respiratory distress. She has no wheezes. Right breast exhibits no mass, no nipple discharge, no skin change and no tenderness. Left breast exhibits no mass, no nipple discharge,  no skin change and no tenderness.  Abdominal: Soft. Bowel sounds are normal. There is no hepatosplenomegaly. There is no tenderness. There is no CVA tenderness.  Genitourinary: Rectum normal, vagina normal and uterus normal. Rectal exam shows guaiac negative stool. Pelvic exam was performed with patient supine. There is no rash, tenderness or lesion on the right labia. There is no rash, tenderness or lesion on the left labia. Cervix exhibits no motion tenderness, no discharge and no friability. Right adnexum displays no mass, no tenderness and no fullness. Left adnexum displays no mass, no tenderness and no fullness.  Musculoskeletal: Normal range of motion.  Lymphadenopathy:    She has no cervical adenopathy.    She has no axillary adenopathy.  Neurological: She is alert and oriented to person, place, and time. She has normal reflexes. No cranial nerve deficit or sensory deficit.  Skin: Skin is warm, dry and intact. No rash noted.  Psychiatric: She has a normal mood and affect. Her speech is normal and behavior is normal. Thought content normal.  Nursing note and vitals reviewed.   BP 118/80   Pulse 68   Resp 16   Ht 5\' 4"  (1.626 m)   Wt 154 lb (69.9 kg)  SpO2 97%   BMI 26.43 kg/m   Assessment and Plan: 1. Annual physical exam Normal except for modest weight gain Continue healthy diet, begin excercise - Lipid panel - Pap IG and HPV (high risk) DNA detection - POCT urinalysis dipstick  2. Breast cancer screening Done in April  3. Mild intermittent asthma without complication Stable with only mild occasional sx relieved by albuterol - CBC with Differential/Platelet  4. Chronic recurrent major depressive disorder ([place]) Continue Prozac and counseling Return if sx worsen to discuss medication change - Comprehensive metabolic panel - TSH  5. Idiopathic insomnia On trazodone  6. Need for shingles vaccine - Zoster Vaccine Adjuvanted [facility]) injection; Inject 0.5 mLs into  the muscle once for 1 dose.  Dispense: 0.5 mL; Refill: 1  7. Osteopenia of hip, unspecified laterality - DG Bone Density; Future   Meds ordered this encounter  Medications  . Zoster Vaccine Adjuvanted [facility]) injection    Sig: Inject 0.5 mLs into the muscle once for 1 dose.    Dispense:  0.5 mL    Refill:  1    Partially dictated using Editor, commissioning. Any errors are unintentional.  Halina Maidens, MD [facility]  03/23/2018

## 2018-03-24 LAB — CBC WITH DIFFERENTIAL/PLATELET
Basophils Absolute: 0 10*3/uL (ref 0.0–0.2)
Basos: 0 %
EOS (ABSOLUTE): 0.2 10*3/uL (ref 0.0–0.4)
Eos: 3 %
Hematocrit: 43.7 % (ref 34.0–46.6)
Hemoglobin: 14.9 g/dL (ref 11.1–15.9)
Immature Grans (Abs): 0 10*3/uL (ref 0.0–0.1)
Immature Granulocytes: 0 %
Lymphocytes Absolute: 2.1 10*3/uL (ref 0.7–3.1)
Lymphs: 31 %
MCH: 30.5 pg (ref 26.6–33.0)
MCHC: 34.1 g/dL (ref 31.5–35.7)
MCV: 90 fL (ref 79–97)
Monocytes Absolute: 0.6 10*3/uL (ref 0.1–0.9)
Monocytes: 9 %
Neutrophils Absolute: 4 10*3/uL (ref 1.4–7.0)
Neutrophils: 57 %
Platelets: 300 10*3/uL (ref 150–450)
RBC: 4.88 x10E6/uL (ref 3.77–5.28)
RDW: 14.1 % (ref 12.3–15.4)
WBC: 7 10*3/uL (ref 3.4–10.8)

## 2018-03-24 LAB — COMPREHENSIVE METABOLIC PANEL
ALT: 22 IU/L (ref 0–32)
AST: 23 IU/L (ref 0–40)
Albumin/Globulin Ratio: 2.4 — ABNORMAL HIGH (ref 1.2–2.2)
Albumin: 4.7 g/dL (ref 3.6–4.8)
Alkaline Phosphatase: 55 IU/L (ref 39–117)
BUN/Creatinine Ratio: 16 (ref 12–28)
BUN: 15 mg/dL (ref 8–27)
Bilirubin Total: 0.5 mg/dL (ref 0.0–1.2)
CO2: 23 mmol/L (ref 20–29)
Calcium: 10.1 mg/dL (ref 8.7–10.3)
Chloride: 102 mmol/L (ref 96–106)
Creatinine, Ser: 0.96 mg/dL (ref 0.57–1.00)
GFR calc Af Amer: 72 mL/min/{1.73_m2} (ref >59)
GFR calc non Af Amer: 63 mL/min/{1.73_m2} (ref >59)
Globulin, Total: 2 g/dL (ref 1.5–4.5)
Glucose: 73 mg/dL (ref 65–99)
Potassium: 4.6 mmol/L (ref 3.5–5.2)
Sodium: 142 mmol/L (ref 134–144)
Total Protein: 6.7 g/dL (ref 6.0–8.5)

## 2018-03-24 LAB — LIPID PANEL
Chol/HDL Ratio: 3.1 ratio (ref 0.0–4.4)
Cholesterol, Total: 215 mg/dL — ABNORMAL HIGH (ref 100–199)
HDL: 70 mg/dL (ref >39)
LDL Calculated: 122 mg/dL — ABNORMAL HIGH (ref 0–99)
Triglycerides: 114 mg/dL (ref 0–149)
VLDL Cholesterol Cal: 23 mg/dL (ref 5–40)

## 2018-03-24 LAB — TSH: TSH: 0.831 u[IU]/mL (ref 0.450–4.500)

## 2018-03-27 LAB — PAP IG AND HPV HIGH-RISK
HPV, high-risk: NEGATIVE
PAP Smear Comment: 0

## 2018-06-28 ENCOUNTER — Other Ambulatory Visit: Payer: Self-pay | Admitting: Internal Medicine

## 2018-07-24 DIAGNOSIS — Z23 Encounter for immunization: Secondary | ICD-10-CM | POA: Diagnosis not present

## 2018-09-25 ENCOUNTER — Other Ambulatory Visit: Payer: Self-pay | Admitting: Internal Medicine

## 2018-10-27 DIAGNOSIS — D485 Neoplasm of uncertain behavior of skin: Secondary | ICD-10-CM | POA: Diagnosis not present

## 2018-10-27 DIAGNOSIS — D2262 Melanocytic nevi of left upper limb, including shoulder: Secondary | ICD-10-CM | POA: Diagnosis not present

## 2018-10-27 DIAGNOSIS — D2261 Melanocytic nevi of right upper limb, including shoulder: Secondary | ICD-10-CM | POA: Diagnosis not present

## 2018-10-27 DIAGNOSIS — D044 Carcinoma in situ of skin of scalp and neck: Secondary | ICD-10-CM | POA: Diagnosis not present

## 2018-10-27 DIAGNOSIS — D225 Melanocytic nevi of trunk: Secondary | ICD-10-CM | POA: Diagnosis not present

## 2018-11-08 ENCOUNTER — Telehealth: Payer: Self-pay

## 2018-11-08 NOTE — Telephone Encounter (Signed)
Exposure needs to be a household contact.

## 2018-11-08 NOTE — Telephone Encounter (Signed)
Patient called leaving VM asking if she should be taking a preventative medication since she was exposed to FLU A.   Please Advise.

## 2018-11-09 NOTE — Telephone Encounter (Signed)
It was her niece who she shopped with on Sunday and she was dx Monday but patient does not live with her.

## 2018-11-14 ENCOUNTER — Other Ambulatory Visit: Payer: Self-pay | Admitting: Internal Medicine

## 2018-12-01 DIAGNOSIS — Z23 Encounter for immunization: Secondary | ICD-10-CM | POA: Diagnosis not present

## 2018-12-27 DIAGNOSIS — L578 Other skin changes due to chronic exposure to nonionizing radiation: Secondary | ICD-10-CM | POA: Diagnosis not present

## 2018-12-27 DIAGNOSIS — D099 Carcinoma in situ, unspecified: Secondary | ICD-10-CM | POA: Diagnosis not present

## 2018-12-27 DIAGNOSIS — D0439 Carcinoma in situ of skin of other parts of face: Secondary | ICD-10-CM | POA: Diagnosis not present

## 2019-01-19 ENCOUNTER — Other Ambulatory Visit: Payer: Self-pay | Admitting: Internal Medicine

## 2019-03-05 ENCOUNTER — Other Ambulatory Visit: Payer: Self-pay | Admitting: Internal Medicine

## 2019-03-06 ENCOUNTER — Other Ambulatory Visit: Payer: Self-pay | Admitting: Internal Medicine

## 2019-03-06 DIAGNOSIS — Z1231 Encounter for screening mammogram for malignant neoplasm of breast: Secondary | ICD-10-CM

## 2019-03-16 ENCOUNTER — Other Ambulatory Visit: Payer: Self-pay

## 2019-03-16 ENCOUNTER — Ambulatory Visit
Admission: RE | Admit: 2019-03-16 | Discharge: 2019-03-16 | Disposition: A | Discharge status: Home / Self Care | Payer: 59 | Source: Ambulatory Visit | Attending: Internal Medicine | Admitting: Internal Medicine

## 2019-03-16 DIAGNOSIS — Z1231 Encounter for screening mammogram for malignant neoplasm of breast: Secondary | ICD-10-CM | POA: Insufficient documentation

## 2019-03-30 ENCOUNTER — Ambulatory Visit (INDEPENDENT_AMBULATORY_CARE_PROVIDER_SITE_OTHER): Payer: 59 | Admitting: Internal Medicine

## 2019-03-30 ENCOUNTER — Other Ambulatory Visit: Payer: Self-pay

## 2019-03-30 ENCOUNTER — Encounter: Payer: Self-pay | Admitting: Internal Medicine

## 2019-03-30 VITALS — BP 113/78 | HR 82 | Resp 16 | Ht 64.0 in | Wt 157.0 lb

## 2019-03-30 DIAGNOSIS — Z0000 Encounter for general adult medical examination without abnormal findings: Secondary | ICD-10-CM | POA: Diagnosis not present

## 2019-03-30 DIAGNOSIS — C4492 Squamous cell carcinoma of skin, unspecified: Secondary | ICD-10-CM | POA: Diagnosis not present

## 2019-03-30 DIAGNOSIS — F5101 Primary insomnia: Secondary | ICD-10-CM | POA: Diagnosis not present

## 2019-03-30 DIAGNOSIS — J4520 Mild intermittent asthma, uncomplicated: Secondary | ICD-10-CM | POA: Diagnosis not present

## 2019-03-30 DIAGNOSIS — F339 Major depressive disorder, recurrent, unspecified: Secondary | ICD-10-CM

## 2019-03-30 DIAGNOSIS — Z23 Encounter for immunization: Secondary | ICD-10-CM | POA: Diagnosis not present

## 2019-03-30 DIAGNOSIS — M810 Age-related osteoporosis without current pathological fracture: Secondary | ICD-10-CM | POA: Diagnosis not present

## 2019-03-30 HISTORY — DX: Squamous cell carcinoma of skin, unspecified: C44.92

## 2019-03-30 LAB — POCT URINALYSIS DIPSTICK
Bilirubin, UA: NEGATIVE
Blood, UA: NEGATIVE
Glucose, UA: NEGATIVE
Ketones, UA: NEGATIVE
Leukocytes, UA: NEGATIVE
Nitrite, UA: NEGATIVE
Protein, UA: NEGATIVE
Spec Grav, UA: 1.015 (ref 1.010–1.025)
Urobilinogen, UA: 0.2 E.U./dL
pH, UA: 6.5 (ref 5.0–8.0)

## 2019-03-30 NOTE — Patient Instructions (Signed)
Call for a Nurse visit to get Prevnar-13 - in a month or two  Someone will call you about the bone density test.

## 2019-03-30 NOTE — Progress Notes (Signed)
Date:  03/30/2019   Name:  Janet Cook   DOB:  [birth year]   MRN:  [id number]   Chief Complaint: Annual Exam Janet Cook is a 63 y.o. female who presents today for her Complete Annual Exam. She feels fairly well. She reports exercising walking. She reports she is sleeping well. She recently had a SCCA of the forehead.  Recent mammogram normal.  No breast complaints. She is planning to retire at the end of this month.  She is still coping with her husband who is in a nursing home -she has not been able to see him for three months - other than Zoom visits.  Due for Prevnar-13 Colonoscopy 2015 Mammogram 03/2019 Pap 03/2018 - neg with cotesting DEXA 2017  Asthma There is no cough, shortness of breath or wheezing. The problem occurs intermittently. Pertinent negatives include no chest pain, fever, headaches or trouble swallowing. Her symptoms are alleviated by beta-agonist. Her past medical history is significant for asthma.  Depression        This is a chronic problem.The problem is unchanged.  Associated symptoms include insomnia.  Associated symptoms include no fatigue and no headaches.  Past treatments include SSRIs - Selective serotonin reuptake inhibitors and other medications.  Compliance with treatment is good.  Previous treatment provided significant relief. Osteopenia - seen on DEXA 2017.  DEXA ordered last year but not done.  She is taking calcium and vitamin D and plans to start more regular exercise once she retires.  Lab Results  Component Value Date   CHOL 215 (H) 03/23/2018   HDL 70 03/23/2018   LDLCALC 122 (H) 03/23/2018   TRIG 114 03/23/2018   CHOLHDL 3.1 03/23/2018   Lab Results  Component Value Date   TSH 0.831 03/23/2018   Lab Results  Component Value Date   CREATININE 0.96 03/23/2018   BUN 15 03/23/2018   NA 142 03/23/2018   K 4.6 03/23/2018   CL 102 03/23/2018   CO2 23 03/23/2018    Review of Systems  Constitutional: Negative for chills, fatigue  and fever.  HENT: Negative for congestion, hearing loss, tinnitus, trouble swallowing and voice change.   Eyes: Negative for visual disturbance.  Respiratory: Negative for cough, chest tightness, shortness of breath and wheezing.   Cardiovascular: Negative for chest pain, palpitations and leg swelling.  Gastrointestinal: Negative for abdominal pain, constipation, diarrhea and vomiting.  Endocrine: Negative for polydipsia and polyuria.  Genitourinary: Negative for dysuria, frequency, genital sores, vaginal bleeding and vaginal discharge.  Musculoskeletal: Negative for arthralgias, gait problem and joint swelling.  Skin: Negative for color change and rash.  Allergic/Immunologic: Positive for environmental allergies.  Neurological: Negative for dizziness, tremors, light-headedness and headaches.  Hematological: Negative for adenopathy. Does not bruise/bleed easily.  Psychiatric/Behavioral: Positive for depression. Negative for dysphoric mood and sleep disturbance. The patient has insomnia. The patient is not nervous/anxious.     Patient Active Problem List   Diagnosis Date Noted  . SCCA (squamous cell carcinoma) of skin 03/30/2019  . Asthma, mild intermittent 08/11/2015  . Allergic state 08/11/2015  . Genital herpes simplex type 2 08/11/2015  . Chronic recurrent major depressive disorder ([place]) 08/11/2015  . OP (osteoporosis) 08/11/2015  . Idiopathic insomnia 08/11/2015  . Avitaminosis D 08/11/2015    No Known Allergies  Past Surgical History:  Procedure Laterality Date  . BREAST BIOPSY Right 2008   CORE - NEG  . COLONOSCOPY  08/11/2014  . LAPAROSCOPY     endometriosis  Social History   Tobacco Use  . Smoking status: Never Smoker  . Smokeless tobacco: Never Used  Substance Use Topics  . Alcohol use: Yes    Alcohol/week: 2.0 standard drinks    Types: 2 Standard drinks or equivalent per week    Comment: occasional  . Drug use: No     Medication list has been reviewed  and updated.  Current Meds  Medication Sig  . albuterol (VENTOLIN HFA) 108 (90 Base) MCG/ACT inhaler INHALE TWO PUFFS FOUR TIMES DAILY AS NEEDED  . Calcium Carbonate-Vit D-Min (CALTRATE 600+D PLUS PO) Take by mouth.  . fexofenadine (ALLEGRA) 60 MG tablet Take 60 mg by mouth 2 (two) times daily.  Marland Kitchen FLUoxetine (PROZAC) 40 MG capsule TAKE 1 CAPSULE BY MOUTH EVERY DAY  . traZODone (DESYREL) 50 MG tablet TAKE 1 TABLET BY MOUTH EVERYDAY AT BEDTIME  . valACYclovir (VALTREX) 1000 MG tablet TAKE 2 TABLETS BY MOUTH TWICE A DAY    PHQ 2/9 Scores 03/30/2019 03/23/2018 04/04/2017  PHQ - 2 Score 0 2 2  PHQ- 9 Score 0 9 2    BP Readings from Last 3 Encounters:  03/30/19 113/78  03/23/18 118/80  04/04/17 122/66    Physical Exam Vitals signs and nursing note reviewed.  Constitutional:      General: She is not in acute distress.    Appearance: She is well-developed.  HENT:     Head: Normocephalic and atraumatic.     Right Ear: Tympanic membrane and ear canal normal.     Left Ear: Tympanic membrane and ear canal normal.  Eyes:     General: No scleral icterus.       Right eye: No discharge.        Left eye: No discharge.     Conjunctiva/sclera: Conjunctivae normal.  Neck:     Musculoskeletal: Normal range of motion. No erythema.     Thyroid: No thyromegaly.     Vascular: No carotid bruit.  Cardiovascular:     Rate and Rhythm: Normal rate and regular rhythm.     Pulses: Normal pulses.     Heart sounds: Normal heart sounds.  Pulmonary:     Effort: Pulmonary effort is normal. No respiratory distress.     Breath sounds: No wheezing.  Chest:     Breasts:        Right: No mass, nipple discharge, skin change or tenderness.        Left: No mass, nipple discharge, skin change or tenderness.  Abdominal:     General: Bowel sounds are normal.     Palpations: Abdomen is soft.     Tenderness: There is no abdominal tenderness.  Musculoskeletal: Normal range of motion.  Lymphadenopathy:      Cervical: No cervical adenopathy.  Skin:    General: Skin is warm and dry.     Findings: No rash.       Neurological:     Mental Status: She is alert and oriented to person, place, and time.     Cranial Nerves: No cranial nerve deficit.     Sensory: No sensory deficit.     Deep Tendon Reflexes: Reflexes are normal and symmetric.  Psychiatric:        Attention and Perception: Attention normal.        Mood and Affect: Mood normal.        Speech: Speech normal.        Behavior: Behavior normal.        Thought Content:  Thought content normal.        Cognition and Memory: Cognition normal.     Wt Readings from Last 3 Encounters:  03/30/19 157 lb (71.2 kg)  03/23/18 154 lb (69.9 kg)  04/04/17 151 lb (68.5 kg)    BP 113/78   Pulse 82   Resp 16   Ht 5\' 4"  (1.626 m)   Wt 157 lb (71.2 kg)   SpO2 97%   BMI 26.95 kg/m   Assessment and Plan: 1. Annual physical exam Normal exam - CBC with Differential/Platelet - Comprehensive metabolic panel - Lipid panel - POCT urinalysis dipstick  2. Mild intermittent asthma without complication Controlled with PRN albuterol only  3. Chronic recurrent major depressive disorder ([place]) Doing well on medications and with therapist visits - TSH  4. Idiopathic insomnia  5. SCCA (squamous cell carcinoma) of skin Recent MOHS surgery - healed  6. Age-related osteoporosis without current pathological fracture Continue Caltrate, begin regular exercise - DG Bone Density; Future  7. Need for vaccination for pneumococcus Call for nurse visit appt in 1-2 months to get Prevnar-13   Partially dictated using [employer]. Any errors are unintentional.  Halina Maidens, MD [facility]  03/30/2019

## 2019-03-31 ENCOUNTER — Encounter: Payer: Self-pay | Admitting: Internal Medicine

## 2019-03-31 DIAGNOSIS — E785 Hyperlipidemia, unspecified: Secondary | ICD-10-CM | POA: Insufficient documentation

## 2019-03-31 LAB — CBC WITH DIFFERENTIAL/PLATELET
Basophils Absolute: 0.1 10*3/uL (ref 0.0–0.2)
Basos: 1 %
EOS (ABSOLUTE): 0.3 10*3/uL (ref 0.0–0.4)
Eos: 4 %
Hematocrit: 44.5 % (ref 34.0–46.6)
Hemoglobin: 14.8 g/dL (ref 11.1–15.9)
Immature Grans (Abs): 0 10*3/uL (ref 0.0–0.1)
Immature Granulocytes: 0 %
Lymphocytes Absolute: 2.2 10*3/uL (ref 0.7–3.1)
Lymphs: 31 %
MCH: 30 pg (ref 26.6–33.0)
MCHC: 33.3 g/dL (ref 31.5–35.7)
MCV: 90 fL (ref 79–97)
Monocytes Absolute: 0.6 10*3/uL (ref 0.1–0.9)
Monocytes: 8 %
Neutrophils Absolute: 4.1 10*3/uL (ref 1.4–7.0)
Neutrophils: 56 %
Platelets: 306 10*3/uL (ref 150–450)
RBC: 4.94 x10E6/uL (ref 3.77–5.28)
RDW: 12.8 % (ref 11.7–15.4)
WBC: 7.2 10*3/uL (ref 3.4–10.8)

## 2019-03-31 LAB — COMPREHENSIVE METABOLIC PANEL
ALT: 21 IU/L (ref 0–32)
AST: 20 IU/L (ref 0–40)
Albumin/Globulin Ratio: 2.1 (ref 1.2–2.2)
Albumin: 4.7 g/dL (ref 3.8–4.8)
Alkaline Phosphatase: 60 IU/L (ref 39–117)
BUN/Creatinine Ratio: 15 (ref 12–28)
BUN: 13 mg/dL (ref 8–27)
Bilirubin Total: 0.4 mg/dL (ref 0.0–1.2)
CO2: 24 mmol/L (ref 20–29)
Calcium: 9 mg/dL (ref 8.7–10.3)
Chloride: 102 mmol/L (ref 96–106)
Creatinine, Ser: 0.88 mg/dL (ref 0.57–1.00)
GFR calc Af Amer: 80 mL/min/{1.73_m2} (ref >59)
GFR calc non Af Amer: 69 mL/min/{1.73_m2} (ref >59)
Globulin, Total: 2.2 g/dL (ref 1.5–4.5)
Glucose: 83 mg/dL (ref 65–99)
Potassium: 4.6 mmol/L (ref 3.5–5.2)
Sodium: 141 mmol/L (ref 134–144)
Total Protein: 6.9 g/dL (ref 6.0–8.5)

## 2019-03-31 LAB — TSH: TSH: 1.02 u[IU]/mL (ref 0.450–4.500)

## 2019-03-31 LAB — LIPID PANEL
Chol/HDL Ratio: 3.4 ratio (ref 0.0–4.4)
Cholesterol, Total: 224 mg/dL — ABNORMAL HIGH (ref 100–199)
HDL: 65 mg/dL (ref >39)
LDL Calculated: 132 mg/dL — ABNORMAL HIGH (ref 0–99)
Triglycerides: 137 mg/dL (ref 0–149)
VLDL Cholesterol Cal: 27 mg/dL (ref 5–40)

## 2019-05-23 ENCOUNTER — Ambulatory Visit: Admission: RE | Admit: 2019-05-23 | Payer: 59 | Source: Ambulatory Visit

## 2019-06-06 ENCOUNTER — Ambulatory Visit
Admission: RE | Admit: 2019-06-06 | Discharge: 2019-06-06 | Disposition: A | Discharge status: Home / Self Care | Payer: Medicare Other | Source: Ambulatory Visit | Attending: Internal Medicine | Admitting: Internal Medicine

## 2019-06-06 DIAGNOSIS — M810 Age-related osteoporosis without current pathological fracture: Secondary | ICD-10-CM | POA: Diagnosis not present

## 2019-07-22 ENCOUNTER — Other Ambulatory Visit: Payer: Self-pay | Admitting: Internal Medicine

## 2019-09-24 ENCOUNTER — Other Ambulatory Visit: Payer: Self-pay | Admitting: Internal Medicine

## 2019-11-20 ENCOUNTER — Other Ambulatory Visit: Payer: Self-pay

## 2019-11-20 ENCOUNTER — Ambulatory Visit (INDEPENDENT_AMBULATORY_CARE_PROVIDER_SITE_OTHER): Payer: Medicare Other | Admitting: Internal Medicine

## 2019-11-20 ENCOUNTER — Encounter: Payer: Self-pay | Admitting: Internal Medicine

## 2019-11-20 VITALS — BP 124/70 | HR 85 | Temp 98.1°F | Ht 64.0 in | Wt 154.0 lb

## 2019-11-20 DIAGNOSIS — N760 Acute vaginitis: Secondary | ICD-10-CM

## 2019-11-20 DIAGNOSIS — B9689 Other specified bacterial agents as the cause of diseases classified elsewhere: Secondary | ICD-10-CM

## 2019-11-20 DIAGNOSIS — F5101 Primary insomnia: Secondary | ICD-10-CM | POA: Diagnosis not present

## 2019-11-20 DIAGNOSIS — A6000 Herpesviral infection of urogenital system, unspecified: Secondary | ICD-10-CM | POA: Diagnosis not present

## 2019-11-20 LAB — POCT WET PREP WITH KOH
KOH Prep POC: NEGATIVE
Trichomonas, UA: NEGATIVE
Yeast Wet Prep HPF POC: NEGATIVE

## 2019-11-20 LAB — POCT URINALYSIS DIPSTICK
Bilirubin, UA: NEGATIVE
Blood, UA: NEGATIVE
Glucose, UA: NEGATIVE
Ketones, UA: NEGATIVE
Leukocytes, UA: NEGATIVE
Nitrite, UA: NEGATIVE
Protein, UA: NEGATIVE
Spec Grav, UA: 1.015 (ref 1.010–1.025)
Urobilinogen, UA: 0.2 E.U./dL
pH, UA: 5 (ref 5.0–8.0)

## 2019-11-20 MED ORDER — METRONIDAZOLE 500 MG PO TABS: 500.0000 mg | ORAL_TABLET | Freq: Two times a day (BID) | ORAL | 0 refills | Status: AC

## 2019-11-20 NOTE — Patient Instructions (Signed)
Covid Vaccine locations: [employer] Dept. [place] [email or web address]

## 2019-11-20 NOTE — Progress Notes (Signed)
Date:  11/20/2019   Name:  Janet Cook   DOB:  [birth year]   MRN:  [id number]   Chief Complaint: vaginal odor (Started over a month ago. No itching, burning or pain. Feels like she has odor and its concerning her. ) and Fatigue (Started a month ago. Waking up in the middle of the night. Takes trazodone at night to help sleep. Some nights she cannot fall back asleep when waking up. )  Vaginal Discharge The patient's primary symptoms include a genital odor and vaginal discharge (odor). The patient's pertinent negatives include no genital itching, genital rash or pelvic pain. This is a chronic problem. The problem occurs daily. The problem has been unchanged. The patient is experiencing no pain. She is not pregnant. Pertinent negatives include no abdominal pain, chills, constipation, diarrhea, fever, headaches or rash. Nothing aggravates the symptoms. She has tried nothing for the symptoms. She is sexually active. No, her partner does not have an STD. She is postmenopausal.  Insomnia Primary symptoms: sleep disturbance, frequent awakening.  The problem occurs nightly. The problem is unchanged. Exacerbated by: falls asleep on the couch at 9 pm then goes to bed at 11 pm.  cat wakes her at 6:30.  Often wakes around 4 AM and has trouble going back to sleep. The symptoms are relieved by medication (trazodone has helped - 50 mg ).  HSV - several more recurrences this past year.  Was wondering if topical acyclovir was a better option.  Still takes oral valtrex 2 tabs bid x 1 day at onset and this works well.   She was stressed last year - her husband passed away and that has been a big adjustment.  Lab Results  Component Value Date   CREATININE 0.88 03/30/2019   BUN 13 03/30/2019   NA 141 03/30/2019   K 4.6 03/30/2019   CL 102 03/30/2019   CO2 24 03/30/2019   Lab Results  Component Value Date   CHOL 224 (H) 03/30/2019   HDL 65 03/30/2019   LDLCALC 132 (H) 03/30/2019   TRIG 137 03/30/2019   CHOLHDL 3.4 03/30/2019   Lab Results  Component Value Date   TSH 1.020 03/30/2019   No results found for: HGBA1C   Review of Systems  Constitutional: Positive for fatigue. Negative for chills and fever.  Respiratory: Negative for cough, chest tightness and shortness of breath.   Cardiovascular: Negative for chest pain and palpitations.  Gastrointestinal: Negative for abdominal pain, constipation and diarrhea.  Genitourinary: Positive for vaginal discharge (odor). Negative for genital sores (none currently), menstrual problem and pelvic pain.  Skin: Negative for rash and wound.  Neurological: Negative for dizziness and headaches.  Psychiatric/Behavioral: Positive for sleep disturbance. Negative for dysphoric mood. The patient has insomnia. The patient is not nervous/anxious.     Patient Active Problem List   Diagnosis Date Noted  . Mild hyperlipidemia 03/31/2019  . SCCA (squamous cell carcinoma) of skin 03/30/2019  . Asthma, mild intermittent 08/11/2015  . Allergic state 08/11/2015  . Genital herpes simplex type 2 08/11/2015  . Chronic recurrent major depressive disorder ([place]) 08/11/2015  . OP (osteoporosis) 08/11/2015  . Idiopathic insomnia 08/11/2015  . Avitaminosis D 08/11/2015    No Known Allergies  Past Surgical History:  Procedure Laterality Date  . BREAST BIOPSY Right 2008   CORE - NEG  . COLONOSCOPY  08/11/2014  . LAPAROSCOPY     endometriosis    Social History   Tobacco Use  . Smoking status:  Never Smoker  . Smokeless tobacco: Never Used  Substance Use Topics  . Alcohol use: Yes    Alcohol/week: 2.0 standard drinks    Types: 2 Standard drinks or equivalent per week    Comment: occasional  . Drug use: No     Medication list has been reviewed and updated.  Current Meds  Medication Sig  . albuterol (VENTOLIN HFA) 108 (90 Base) MCG/ACT inhaler INHALE TWO PUFFS FOUR TIMES DAILY AS NEEDED  . Calcium Carbonate-Vit D-Min (CALTRATE 600+D PLUS PO) Take by  mouth.  . fexofenadine (ALLEGRA) 60 MG tablet Take 60 mg by mouth 2 (two) times daily.  Marland Kitchen FLUoxetine (PROZAC) 40 MG capsule TAKE 1 CAPSULE BY MOUTH EVERY DAY  . traZODone (DESYREL) 50 MG tablet TAKE 1 TABLET BY MOUTH EVERYDAY AT BEDTIME  . valACYclovir (VALTREX) 1000 MG tablet TAKE 2 TABLETS BY MOUTH TWICE A DAY    PHQ 2/9 Scores 11/20/2019 11/20/2019 03/30/2019 03/23/2018  PHQ - 2 Score 0 0 0 2  PHQ- 9 Score 0 - 0 9    BP Readings from Last 3 Encounters:  11/20/19 124/70  03/30/19 113/78  03/23/18 118/80    Physical Exam Vitals and nursing note reviewed.  Constitutional:      General: She is not in acute distress.    Appearance: Normal appearance. She is well-developed.  HENT:     Head: Normocephalic and atraumatic.  Cardiovascular:     Rate and Rhythm: Normal rate and regular rhythm.  Pulmonary:     Effort: Pulmonary effort is normal. No respiratory distress.     Breath sounds: No wheezing or rhonchi.  Abdominal:     General: Abdomen is flat.     Palpations: Abdomen is soft. There is no mass.     Tenderness: There is no abdominal tenderness. There is no guarding.  Musculoskeletal:        General: Normal range of motion.     Cervical back: Normal range of motion.     Right lower leg: No edema.     Left lower leg: No edema.  Lymphadenopathy:     Cervical: No cervical adenopathy.  Skin:    General: Skin is warm and dry.     Findings: No rash.  Neurological:     Mental Status: She is alert and oriented to person, place, and time.  Psychiatric:        Behavior: Behavior normal.        Thought Content: Thought content normal.   Microscopic wet-mount exam shows clue cells, excessive bacteria. Urine dipstick shows negative for all components.  Micro exam: not done.   Wt Readings from Last 3 Encounters:  11/20/19 154 lb (69.9 kg)  03/30/19 157 lb (71.2 kg)  03/23/18 154 lb (69.9 kg)    BP 124/70   Pulse 85   Temp 98.1 F (36.7 C) (Oral)   Ht 5\' 4"  (1.626 m)   Wt  154 lb (69.9 kg)   SpO2 97%   BMI 26.43 kg/m   Assessment and Plan: 1. BV (bacterial vaginosis) Likely the cause of vaginal symptoms Follow up if no improvement. Patient reminded to avoid alcohol while taking flagyl - metroNIDAZOLE (FLAGYL) 500 MG tablet; Take 1 tablet (500 mg total) by mouth 2 (two) times daily for 7 days.  Dispense: 14 tablet; Refill: 0  2. Idiopathic insomnia Discussed some changes to her sleep patterns Try to avoid last evening naps May increase Trazodone to 100 mg - call for new Rx if  needed  3. Genital herpes simplex type 2 Continue to use Valtrex PRN   Partially dictated using Editor, commissioning. Any errors are unintentional.  Halina Maidens, MD [facility]  11/20/2019

## 2019-11-23 ENCOUNTER — Other Ambulatory Visit: Payer: Self-pay

## 2019-11-23 ENCOUNTER — Ambulatory Visit: Payer: Medicare Other | Attending: Internal Medicine

## 2019-11-23 DIAGNOSIS — Z23 Encounter for immunization: Secondary | ICD-10-CM | POA: Insufficient documentation

## 2019-11-23 NOTE — Progress Notes (Signed)
   Covid-19 Vaccination Clinic  Name:  Janet Cook    MRN: [id number] DOB: 06-Nov-1955  11/23/2019  Ms. Janet Cook was observed post Covid-19 immunization for 15 minutes without incidence. She was provided with Vaccine Information Sheet and instruction to access the V-Safe system.   Ms. Janet Cook was instructed to call 911 with any severe reactions post vaccine: Marland Kitchen Difficulty breathing  . Swelling of your face and throat  . A fast heartbeat  . A bad rash all over your body  . Dizziness and weakness    Immunizations Administered    Name Date Dose VIS Date Route   Pfizer COVID-19 Vaccine 11/23/2019 10:37 AM 0.3 mL 09/21/2019 Intramuscular   Manufacturer: [place]   Lot: [id number]   [place]: [id number]

## 2019-12-16 ENCOUNTER — Other Ambulatory Visit: Payer: Self-pay | Admitting: Internal Medicine

## 2019-12-19 ENCOUNTER — Ambulatory Visit: Payer: Medicare Other | Attending: Internal Medicine

## 2019-12-19 DIAGNOSIS — Z23 Encounter for immunization: Secondary | ICD-10-CM | POA: Insufficient documentation

## 2019-12-19 NOTE — Progress Notes (Signed)
   Covid-19 Vaccination [facility]  Name:  Janet Cook    MRN: [id number] DOB: 03-Jan-1956  12/19/2019  Ms. Janet Cook was observed post Covid-19 immunization for 15 minutes without incident. She was provided with Vaccine Information Sheet and instruction to access the V-Safe system.   Ms. Janet Cook was instructed to call 911 with any severe reactions post vaccine: Marland Kitchen Difficulty breathing  . Swelling of face and throat  . A fast heartbeat  . A bad rash all over body  . Dizziness and weakness   Immunizations Administered    Name Date Dose VIS Date Route   Pfizer COVID-19 Vaccine 12/19/2019  8:50 AM 0.3 mL 09/21/2019 Intramuscular   Manufacturer: [employer]   Lot: [id number]   [place]: [id number]

## 2020-02-11 ENCOUNTER — Other Ambulatory Visit: Payer: Self-pay | Admitting: Internal Medicine

## 2020-02-11 DIAGNOSIS — Z1231 Encounter for screening mammogram for malignant neoplasm of breast: Secondary | ICD-10-CM

## 2020-02-25 ENCOUNTER — Other Ambulatory Visit: Payer: Self-pay | Admitting: Internal Medicine

## 2020-03-25 ENCOUNTER — Ambulatory Visit
Admission: RE | Admit: 2020-03-25 | Discharge: 2020-03-25 | Disposition: A | Discharge status: Home / Self Care | Payer: Medicare Other | Source: Ambulatory Visit | Attending: Internal Medicine | Admitting: Internal Medicine

## 2020-03-25 DIAGNOSIS — Z1231 Encounter for screening mammogram for malignant neoplasm of breast: Secondary | ICD-10-CM | POA: Insufficient documentation

## 2020-04-01 ENCOUNTER — Encounter: Payer: 59 | Admitting: Internal Medicine

## 2020-04-08 ENCOUNTER — Ambulatory Visit (INDEPENDENT_AMBULATORY_CARE_PROVIDER_SITE_OTHER): Payer: Medicare Other | Admitting: Internal Medicine

## 2020-04-08 ENCOUNTER — Encounter: Payer: Self-pay | Admitting: Internal Medicine

## 2020-04-08 ENCOUNTER — Other Ambulatory Visit: Payer: Self-pay

## 2020-04-08 VITALS — BP 102/68 | HR 74 | Temp 98.0°F | Ht 64.0 in | Wt 169.0 lb

## 2020-04-08 DIAGNOSIS — J4520 Mild intermittent asthma, uncomplicated: Secondary | ICD-10-CM | POA: Diagnosis not present

## 2020-04-08 DIAGNOSIS — Z0000 Encounter for general adult medical examination without abnormal findings: Secondary | ICD-10-CM | POA: Diagnosis not present

## 2020-04-08 DIAGNOSIS — M810 Age-related osteoporosis without current pathological fracture: Secondary | ICD-10-CM

## 2020-04-08 DIAGNOSIS — Z23 Encounter for immunization: Secondary | ICD-10-CM

## 2020-04-08 DIAGNOSIS — F339 Major depressive disorder, recurrent, unspecified: Secondary | ICD-10-CM | POA: Diagnosis not present

## 2020-04-08 DIAGNOSIS — Z1211 Encounter for screening for malignant neoplasm of colon: Secondary | ICD-10-CM | POA: Diagnosis not present

## 2020-04-08 DIAGNOSIS — D126 Benign neoplasm of colon, unspecified: Secondary | ICD-10-CM

## 2020-04-08 DIAGNOSIS — E785 Hyperlipidemia, unspecified: Secondary | ICD-10-CM

## 2020-04-08 NOTE — Progress Notes (Signed)
Date:  04/08/2020   Name:  Janet Cook   DOB:  April 02, 1956   MRN:  [id number]   Chief Complaint: Annual Exam (Breast Exam. No pap. Prevnar 13.)  Janet Cook is a 64 y.o. female who presents today for her Complete Annual Exam. She feels well. She reports exercising walking some. She reports she is sleeping fairly well. Breast complaints none. She is now dating someone and is very pleased.  Sexually active with no concerns.  Mammogram:  03/2020 Pap smear: discontinued Colonoscopy: 08/2014 TA due for repeat  Immunization History  Administered Date(s) Administered  . Influenza, High Dose Seasonal PF 07/27/2019  . Influenza,inj,Quad PF,6+ Mos 07/17/2017, 07/24/2018  . Influenza-Unspecified 07/14/2015, 07/27/2019  . PFIZER SARS-COV-2 Vaccination 11/23/2019, 12/19/2019  . Tdap 08/25/2011  . Zoster 11/18/2014  . Zoster Recombinat (Shingrix) 07/24/2018, 12/01/2018    Depression        This is a chronic problem.  The problem has been resolved since onset.  Associated symptoms include no fatigue and no headaches.  Past treatments include SSRIs - Selective serotonin reuptake inhibitors.  Compliance with treatment is good. Asthma There is no cough, shortness of breath or wheezing. This is a recurrent problem. The problem occurs intermittently. Pertinent negatives include no chest pain, fever, headaches or trouble swallowing. Her symptoms are alleviated by beta-agonist. Her past medical history is significant for asthma.    Lab Results  Component Value Date   CREATININE 0.88 03/30/2019   BUN 13 03/30/2019   NA 141 03/30/2019   K 4.6 03/30/2019   CL 102 03/30/2019   CO2 24 03/30/2019   Lab Results  Component Value Date   CHOL 224 (H) 03/30/2019   HDL 65 03/30/2019   LDLCALC 132 (H) 03/30/2019   TRIG 137 03/30/2019   CHOLHDL 3.4 03/30/2019   Lab Results  Component Value Date   TSH 1.020 03/30/2019   No results found for: HGBA1C Lab Results  Component Value Date   WBC  7.2 03/30/2019   HGB 14.8 03/30/2019   HCT 44.5 03/30/2019   MCV 90 03/30/2019   PLT 306 03/30/2019   Lab Results  Component Value Date   ALT 21 03/30/2019   AST 20 03/30/2019   ALKPHOS 60 03/30/2019   BILITOT 0.4 03/30/2019     Review of Systems  Constitutional: Negative for chills, fatigue and fever.  HENT: Negative for congestion, hearing loss, tinnitus, trouble swallowing and voice change.   Eyes: Negative for visual disturbance.  Respiratory: Negative for cough, chest tightness, shortness of breath and wheezing.   Cardiovascular: Negative for chest pain, palpitations and leg swelling.  Gastrointestinal: Negative for abdominal pain, constipation, diarrhea and vomiting.  Endocrine: Negative for polydipsia and polyuria.  Genitourinary: Negative for dysuria, frequency, genital sores, menstrual problem, vaginal bleeding and vaginal discharge.  Musculoskeletal: Negative for arthralgias, gait problem and joint swelling.  Skin: Negative for color change and rash.  Neurological: Negative for dizziness, tremors, light-headedness and headaches.  Hematological: Negative for adenopathy. Does not bruise/bleed easily.  Psychiatric/Behavioral: Positive for depression. Negative for dysphoric mood and sleep disturbance. The patient is not nervous/anxious.     Patient Active Problem List   Diagnosis Date Noted  . Mild hyperlipidemia 03/31/2019  . SCCA (squamous cell carcinoma) of skin 03/30/2019  . Asthma, mild intermittent 08/11/2015  . Allergic state 08/11/2015  . Genital herpes simplex type 2 08/11/2015  . Chronic recurrent major depressive disorder ([facility]) 08/11/2015  . OP (osteoporosis) 08/11/2015  . Idiopathic insomnia  08/11/2015  . Avitaminosis D 08/11/2015    No Known Allergies  Past Surgical History:  Procedure Laterality Date  . BREAST BIOPSY Right 2008   CORE - NEG  . COLONOSCOPY  08/11/2014  . LAPAROSCOPY     endometriosis    Social History   Tobacco Use  .  Smoking status: Never Smoker  . Smokeless tobacco: Never Used  Vaping Use  . Vaping Use: Never used  Substance Use Topics  . Alcohol use: Yes    Alcohol/week: 2.0 standard drinks    Types: 2 Standard drinks or equivalent per week    Comment: occasional  . Drug use: No     Medication list has been reviewed and updated.  Current Meds  Medication Sig  . albuterol (VENTOLIN HFA) 108 (90 Base) MCG/ACT inhaler INHALE TWO PUFFS FOUR TIMES DAILY AS NEEDED  . Calcium Carbonate-Vit D-Min (CALTRATE 600+D PLUS PO) Take by mouth.  . fexofenadine (ALLEGRA) 60 MG tablet Take 60 mg by mouth 2 (two) times daily.  Marland Kitchen FLUoxetine (PROZAC) 40 MG capsule TAKE 1 CAPSULE BY MOUTH EVERY DAY  . traZODone (DESYREL) 50 MG tablet TAKE 1 TABLET BY MOUTH EVERYDAY AT BEDTIME  . valACYclovir (VALTREX) 1000 MG tablet TAKE 2 TABLETS BY MOUTH TWICE A DAY    PHQ 2/9 Scores 04/08/2020 11/20/2019 11/20/2019 03/30/2019  PHQ - 2 Score 0 0 0 0  PHQ- 9 Score 0 0 - 0    GAD 7 : Generalized Anxiety Score 04/08/2020  Nervous, Anxious, on Edge 0  Control/stop worrying 0  Worry too much - different things 0  Trouble relaxing 0  Restless 0  Easily annoyed or irritable 0  Afraid - awful might happen 0  Total GAD 7 Score 0  Anxiety Difficulty Not difficult at all    BP Readings from Last 3 Encounters:  04/08/20 102/68  11/20/19 124/70  03/30/19 113/78    Physical Exam Vitals and nursing note reviewed.  Constitutional:      General: She is not in acute distress.    Appearance: She is well-developed.  HENT:     Head: Normocephalic and atraumatic.     Right Ear: Tympanic membrane and ear canal normal.     Left Ear: Tympanic membrane and ear canal normal.     Nose:     Right Sinus: No maxillary sinus tenderness.     Left Sinus: No maxillary sinus tenderness.  Eyes:     General: No scleral icterus.       Right eye: No discharge.        Left eye: No discharge.     Conjunctiva/sclera: Conjunctivae normal.  Neck:      Thyroid: No thyromegaly.     Vascular: No carotid bruit.  Cardiovascular:     Rate and Rhythm: Normal rate and regular rhythm.     Pulses: Normal pulses.     Heart sounds: Normal heart sounds.  Pulmonary:     Effort: Pulmonary effort is normal. No respiratory distress.     Breath sounds: No wheezing.  Chest:     Breasts:        Right: No mass, nipple discharge, skin change or tenderness.        Left: No mass, nipple discharge, skin change or tenderness.  Abdominal:     General: Bowel sounds are normal.     Palpations: Abdomen is soft.     Tenderness: There is no abdominal tenderness.  Musculoskeletal:  General: Normal range of motion.     Cervical back: Normal range of motion. No erythema.     Right lower leg: No edema.     Left lower leg: No edema.  Lymphadenopathy:     Cervical: No cervical adenopathy.  Skin:    General: Skin is warm and dry.     Findings: No rash.  Neurological:     Mental Status: She is alert and oriented to person, place, and time.     Cranial Nerves: No cranial nerve deficit.     Sensory: No sensory deficit.     Deep Tendon Reflexes: Reflexes are normal and symmetric.  Psychiatric:        Attention and Perception: Attention normal.        Mood and Affect: Mood normal.     Wt Readings from Last 3 Encounters:  04/08/20 169 lb (76.7 kg)  11/20/19 154 lb (69.9 kg)  03/30/19 157 lb (71.2 kg)    BP 102/68 (BP Location: Right Arm, Patient Position: Sitting, Cuff Size: Normal)   Pulse 74   Temp 98 F (36.7 C) (Oral)   Ht 5\' 4"  (1.626 m)   Wt 169 lb (76.7 kg)   SpO2 96%   BMI 29.01 kg/m   Assessment and Plan: 1. Annual physical exam Normal exam Needs to begin regular exericse 150 min per week  2. Colon cancer screening Overdue for screening due to hx of TA  - Ambulatory referral to Gastroenterology  3. Mild intermittent asthma without complication Recommend using albuterol prior to exercise if needed - CBC with  Differential/Platelet  4. Chronic recurrent major depressive disorder ([facility]) Clinically stable on current regimen with good control of symptoms, No SI or HI. Will continue current therapy. - TSH  5. Mild hyperlipidemia - Comprehensive metabolic panel - Lipid panel  6. Age-related osteoporosis without current pathological fracture Last DEXA showed improvement to osteopenia Continue calcium and vitamin D  7. Need for vaccination for pneumococcus - Pneumococcal conjugate vaccine 13-valent IM  8. Adenomatous polyp of colon, unspecified part of colon - Ambulatory referral to Gastroenterology   Partially dictated using Dragon software. Any errors are unintentional.  Halina Maidens, MD [facility]  04/08/2020

## 2020-04-09 LAB — LIPID PANEL
Chol/HDL Ratio: 3.6 ratio (ref 0.0–4.4)
Cholesterol, Total: 212 mg/dL — ABNORMAL HIGH (ref 100–199)
HDL: 59 mg/dL (ref >39)
LDL Chol Calc (NIH): 125 mg/dL — ABNORMAL HIGH (ref 0–99)
Triglycerides: 160 mg/dL — ABNORMAL HIGH (ref 0–149)
VLDL Cholesterol Cal: 28 mg/dL (ref 5–40)

## 2020-04-09 LAB — CBC WITH DIFFERENTIAL/PLATELET
Basophils Absolute: 0.1 10*3/uL (ref 0.0–0.2)
Basos: 1 %
EOS (ABSOLUTE): 0.2 10*3/uL (ref 0.0–0.4)
Eos: 3 %
Hematocrit: 45.2 % (ref 34.0–46.6)
Hemoglobin: 15.3 g/dL (ref 11.1–15.9)
Immature Grans (Abs): 0 10*3/uL (ref 0.0–0.1)
Immature Granulocytes: 0 %
Lymphocytes Absolute: 2 10*3/uL (ref 0.7–3.1)
Lymphs: 27 %
MCH: 31 pg (ref 26.6–33.0)
MCHC: 33.8 g/dL (ref 31.5–35.7)
MCV: 92 fL (ref 79–97)
Monocytes Absolute: 0.6 10*3/uL (ref 0.1–0.9)
Monocytes: 8 %
Neutrophils Absolute: 4.6 10*3/uL (ref 1.4–7.0)
Neutrophils: 61 %
Platelets: 313 10*3/uL (ref 150–450)
RBC: 4.94 x10E6/uL (ref 3.77–5.28)
RDW: 12.6 % (ref 11.7–15.4)
WBC: 7.5 10*3/uL (ref 3.4–10.8)

## 2020-04-09 LAB — COMPREHENSIVE METABOLIC PANEL
ALT: 21 IU/L (ref 0–32)
AST: 20 IU/L (ref 0–40)
Albumin/Globulin Ratio: 1.9 (ref 1.2–2.2)
Albumin: 4.4 g/dL (ref 3.8–4.8)
Alkaline Phosphatase: 64 IU/L (ref 48–121)
BUN/Creatinine Ratio: 19 (ref 12–28)
BUN: 16 mg/dL (ref 8–27)
Bilirubin Total: 0.4 mg/dL (ref 0.0–1.2)
CO2: 26 mmol/L (ref 20–29)
Calcium: 9.5 mg/dL (ref 8.7–10.3)
Chloride: 100 mmol/L (ref 96–106)
Creatinine, Ser: 0.84 mg/dL (ref 0.57–1.00)
GFR calc Af Amer: 84 mL/min/{1.73_m2} (ref >59)
GFR calc non Af Amer: 73 mL/min/{1.73_m2} (ref >59)
Globulin, Total: 2.3 g/dL (ref 1.5–4.5)
Glucose: 87 mg/dL (ref 65–99)
Potassium: 4.8 mmol/L (ref 3.5–5.2)
Sodium: 140 mmol/L (ref 134–144)
Total Protein: 6.7 g/dL (ref 6.0–8.5)

## 2020-04-09 LAB — TSH: TSH: 1.11 u[IU]/mL (ref 0.450–4.500)

## 2020-05-13 ENCOUNTER — Telehealth (INDEPENDENT_AMBULATORY_CARE_PROVIDER_SITE_OTHER): Payer: Self-pay | Admitting: Gastroenterology

## 2020-05-13 ENCOUNTER — Other Ambulatory Visit: Payer: Self-pay

## 2020-05-13 DIAGNOSIS — Z86010 Personal history of colonic polyps: Secondary | ICD-10-CM

## 2020-05-13 DIAGNOSIS — Z860101 Personal history of adenomatous and serrated colon polyps: Secondary | ICD-10-CM

## 2020-05-13 DIAGNOSIS — Z1211 Encounter for screening for malignant neoplasm of colon: Secondary | ICD-10-CM

## 2020-05-13 NOTE — Progress Notes (Signed)
Gastroenterology Pre-Procedure Review  Request Date: 07/07/20 Requesting Physician: Dr. Marius Ditch  PATIENT REVIEW QUESTIONS: The patient responded to the following health history questions as indicated:    1. Are you having any GI issues? no 2. Do you have a personal history of Polyps? yes (5 years ago colonsocopy performed by Dr. Allen Norris) 3. Do you have a family history of Colon Cancer or Polyps? no 4. Diabetes Mellitus? no 5. Joint replacements in the past 12 months?no 6. Major health problems in the past 3 months?no 7. Any artificial heart valves, MVP, or defibrillator?no    MEDICATIONS & ALLERGIES:    Patient reports the following regarding taking any anticoagulation/antiplatelet therapy:   Plavix, Coumadin, Eliquis, Xarelto, Lovenox, Pradaxa, Brilinta, or Effient? no Aspirin? no  Patient confirms/reports the following medications:  Current Outpatient Medications  Medication Sig Dispense Refill  . albuterol (VENTOLIN HFA) 108 (90 Base) MCG/ACT inhaler INHALE TWO PUFFS FOUR TIMES DAILY AS NEEDED 18 Inhaler 1  . Calcium Carbonate-Vit D-Min (CALTRATE 600+D PLUS PO) Take by mouth.    . fexofenadine (ALLEGRA) 60 MG tablet Take 60 mg by mouth 2 (two) times daily.    Marland Kitchen FLUoxetine (PROZAC) 40 MG capsule TAKE 1 CAPSULE BY MOUTH EVERY DAY 90 capsule 3  . traZODone (DESYREL) 50 MG tablet TAKE 1 TABLET BY MOUTH EVERYDAY AT BEDTIME 90 tablet 1  . valACYclovir (VALTREX) 1000 MG tablet TAKE 2 TABLETS BY MOUTH TWICE A DAY 4 tablet 5   No current facility-administered medications for this visit.    Patient confirms/reports the following allergies:  No Known Allergies  No orders of the defined types were placed in this encounter.   AUTHORIZATION INFORMATION Primary Insurance: 1D#: Group #:  Secondary Insurance: 1D#: Group #:  SCHEDULE INFORMATION: Date: 07/07/20 Time: Location:[facility]

## 2020-05-26 ENCOUNTER — Telehealth: Payer: Self-pay | Admitting: Internal Medicine

## 2020-05-26 NOTE — Telephone Encounter (Signed)
Left message for patient to call back and schedule Medicare Annual Wellness Visit (AWV) either virtually/audio only or in office. Whichever the patients preference is.  No history of AWV; please schedule at anytime with [facility] Health Advisor.  This should be a 40 minute visit  AWV-I

## 2020-06-11 ENCOUNTER — Ambulatory Visit (INDEPENDENT_AMBULATORY_CARE_PROVIDER_SITE_OTHER): Payer: 59

## 2020-06-11 DIAGNOSIS — Z0000 Encounter for general adult medical examination without abnormal findings: Secondary | ICD-10-CM | POA: Diagnosis not present

## 2020-06-11 NOTE — Patient Instructions (Signed)
Janet Cook , Thank you for taking time to come for your Medicare Wellness Visit. I appreciate your ongoing commitment to your health goals. Please review the following plan we discussed and let me know if I can assist you in the future.   Screening recommendations/referrals: Colonoscopy: done 09/04/14. Scheduled for 07/07/20. Mammogram: done 03/25/20 Bone Density: done 06/06/19 Recommended yearly ophthalmology/optometry visit for glaucoma screening and checkup Recommended yearly dental visit for hygiene and checkup  Vaccinations: Influenza vaccine: done 07/27/19 Pneumococcal vaccine: done 04/08/20 Tdap vaccine: done 08/25/11 Shingles vaccine: done 07/24/18 & 12/01/18    Covid-19: done 11/23/19 & 12/19/19   Advanced directives: Please bring a copy of your health care power of attorney and living will to the office at your convenience.  Conditions/risks identified: recommend increasing physical activity to at least 3 days per week  Next appointment: Follow up in one year for your annual wellness visit    Preventive Care 65 Years and Older, Female Preventive care refers to lifestyle choices and visits with your health care provider that can promote health and wellness. What does preventive care include?  A yearly physical exam. This is also called an annual well check.  Dental exams once or twice a year.  Routine eye exams. Ask your health care provider how often you should have your eyes checked.  Personal lifestyle choices, including:  Daily care of your teeth and gums.  Regular physical activity.  Eating a healthy diet.  Avoiding tobacco and drug use.  Limiting alcohol use.  Practicing safe sex.  Taking low-dose aspirin every day.  Taking vitamin and mineral supplements as recommended by your health care provider. What happens during an annual well check? The services and screenings done by your health care provider during your annual well check will depend on your age,  overall health, lifestyle risk factors, and family history of disease. Counseling  Your health care provider may ask you questions about your:  Alcohol use.  Tobacco use.  Drug use.  Emotional well-being.  Home and relationship well-being.  Sexual activity.  Eating habits.  History of falls.  Memory and ability to understand (cognition).  Work and work Statistician.  Reproductive health. Screening  You may have the following tests or measurements:  Height, weight, and BMI.  Blood pressure.  Lipid and cholesterol levels. These may be checked every 5 years, or more frequently if you are over 61 years old.  Skin check.  Lung cancer screening. You may have this screening every year starting at age 57 if you have a 30-pack-year history of smoking and currently smoke or have quit within the past 15 years.  Fecal occult blood test (FOBT) of the stool. You may have this test every year starting at age 59.  Flexible sigmoidoscopy or colonoscopy. You may have a sigmoidoscopy every 5 years or a colonoscopy every 10 years starting at age 75.  Hepatitis C blood test.  Hepatitis B blood test.  Sexually transmitted disease (STD) testing.  Diabetes screening. This is done by checking your blood sugar (glucose) after you have not eaten for a while (fasting). You may have this done every 1-3 years.  Bone density scan. This is done to screen for osteoporosis. You may have this done starting at age 44.  Mammogram. This may be done every 1-2 years. Talk to your health care provider about how often you should have regular mammograms. Talk with your health care provider about your test results, treatment options, and if necessary, the need  for more tests. Vaccines  Your health care provider may recommend certain vaccines, such as:  Influenza vaccine. This is recommended every year.  Tetanus, diphtheria, and acellular pertussis (Tdap, Td) vaccine. You may need a Td booster every 10  years.  Zoster vaccine. You may need this after age 31.  Pneumococcal 13-valent conjugate (PCV13) vaccine. One dose is recommended after age 84.  Pneumococcal polysaccharide (PPSV23) vaccine. One dose is recommended after age 53. Talk to your health care provider about which screenings and vaccines you need and how often you need them. This information is not intended to replace advice given to you by your health care provider. Make sure you discuss any questions you have with your health care provider. Document Released: 10/24/2015 Document Revised: 06/16/2016 Document Reviewed: 07/29/2015 Elsevier Interactive Patient Education  2017 [place] Prevention in the Home Falls can cause injuries. They can happen to people of all ages. There are many things you can do to make your home safe and to help prevent falls. What can I do on the outside of my home?  Regularly fix the edges of walkways and driveways and fix any cracks.  Remove anything that might make you trip as you walk through a door, such as a raised step or threshold.  Trim any bushes or trees on the path to your home.  Use bright outdoor lighting.  Clear any walking paths of anything that might make someone trip, such as rocks or tools.  Regularly check to see if handrails are loose or broken. Make sure that both sides of any steps have handrails.  Any raised decks and porches should have guardrails on the edges.  Have any leaves, snow, or ice cleared regularly.  Use sand or salt on walking paths during winter.  Clean up any spills in your garage right away. This includes oil or grease spills. What can I do in the bathroom?  Use night lights.  Install grab bars by the toilet and in the tub and shower. Do not use towel bars as grab bars.  Use non-skid mats or decals in the tub or shower.  If you need to sit down in the shower, use a plastic, non-slip stool.  Keep the floor dry. Clean up any water that  spills on the floor as soon as it happens.  Remove soap buildup in the tub or shower regularly.  Attach bath mats securely with double-sided non-slip rug tape.  Do not have throw rugs and other things on the floor that can make you trip. What can I do in the bedroom?  Use night lights.  Make sure that you have a light by your bed that is easy to reach.  Do not use any sheets or blankets that are too big for your bed. They should not hang down onto the floor.  Have a firm chair that has side arms. You can use this for support while you get dressed.  Do not have throw rugs and other things on the floor that can make you trip. What can I do in the kitchen?  Clean up any spills right away.  Avoid walking on wet floors.  Keep items that you use a lot in easy-to-reach places.  If you need to reach something above you, use a strong step stool that has a grab bar.  Keep electrical cords out of the way.  Do not use floor polish or wax that makes floors slippery. If you must use wax, use  non-skid floor wax.  Do not have throw rugs and other things on the floor that can make you trip. What can I do with my stairs?  Do not leave any items on the stairs.  Make sure that there are handrails on both sides of the stairs and use them. Fix handrails that are broken or loose. Make sure that handrails are as long as the stairways.  Check any carpeting to make sure that it is firmly attached to the stairs. Fix any carpet that is loose or worn.  Avoid having throw rugs at the top or bottom of the stairs. If you do have throw rugs, attach them to the floor with carpet tape.  Make sure that you have a light switch at the top of the stairs and the bottom of the stairs. If you do not have them, ask someone to add them for you. What else can I do to help prevent falls?  Wear shoes that:  Do not have high heels.  Have rubber bottoms.  Are comfortable and fit you well.  Are closed at the  toe. Do not wear sandals.  If you use a stepladder:  Make sure that it is fully opened. Do not climb a closed stepladder.  Make sure that both sides of the stepladder are locked into place.  Ask someone to hold it for you, if possible.  Clearly mark and make sure that you can see:  Any grab bars or handrails.  First and last steps.  Where the edge of each step is.  Use tools that help you move around (mobility aids) if they are needed. These include:  Canes.  Walkers.  Scooters.  Crutches.  Turn on the lights when you go into a dark area. Replace any light bulbs as soon as they burn out.  Set up your furniture so you have a clear path. Avoid moving your furniture around.  If any of your floors are uneven, fix them.  If there are any pets around you, be aware of where they are.  Review your medicines with your doctor. Some medicines can make you feel dizzy. This can increase your chance of falling. Ask your doctor what other things that you can do to help prevent falls. This information is not intended to replace advice given to you by your health care provider. Make sure you discuss any questions you have with your health care provider. Document Released: 07/24/2009 Document Revised: 03/04/2016 Document Reviewed: 11/01/2014 Elsevier Interactive Patient Education  2017 [employer].

## 2020-06-11 NOTE — Progress Notes (Addendum)
Subjective:   Janet Cook is a 64 y.o. female who presents for an Initial Medicare Annual Wellness Visit.  Virtual Visit via Telephone Note  I connected with  Janet Cook on 06/11/20 at  2:00 PM EDT by telephone and verified that I am speaking with the correct person using two identifiers.  Medicare Annual Wellness visit completed telephonically due to Covid-19 pandemic.   Location: Patient: home Provider: [facility]   I discussed the limitations, risks, security and privacy concerns of performing an evaluation and management service by telephone and the availability of in person appointments. The patient expressed understanding and agreed to proceed.  Unable to perform video visit due to video visit attempted and failed and/or patient does not have video capability.   Some vital signs may be absent or patient reported.   Clemetine Marker, LPN    Review of Systems     Cardiac Risk Factors include: advanced age (>60men, >29 women)     Objective:    There were no vitals filed for this visit. There is no height or weight on file to calculate BMI.  Advanced Directives 06/11/2020 01/07/2016  Does Patient Have a Medical Advance Directive? Yes No  Type of Paramedic of [place];Living will -  Copy of [place] in Chart? No - copy requested -  Would patient like information on creating a medical advance directive? - No - patient declined information    Current Medications (verified) Outpatient Encounter Medications as of 06/11/2020  Medication Sig  . albuterol (VENTOLIN HFA) 108 (90 Base) MCG/ACT inhaler INHALE TWO PUFFS FOUR TIMES DAILY AS NEEDED  . Calcium Carbonate-Vit D-Min (CALTRATE 600+D PLUS PO) Take by mouth.  . fexofenadine (ALLEGRA) 60 MG tablet Take 60 mg by mouth 2 (two) times daily.  Marland Kitchen FLUoxetine (PROZAC) 40 MG capsule TAKE 1 CAPSULE BY MOUTH EVERY DAY  . traZODone (DESYREL) 50 MG tablet TAKE 1 TABLET BY MOUTH EVERYDAY AT  BEDTIME  . valACYclovir (VALTREX) 1000 MG tablet TAKE 2 TABLETS BY MOUTH TWICE A DAY   No facility-administered encounter medications on file as of 06/11/2020.    Allergies (verified) Patient has no known allergies.   History: Past Medical History:  Diagnosis Date  . Anxiety 05/11/1982  . Asthma 09/20/1983  . Depression 05/11/1982  . Mild hyperlipidemia   . OP (osteoporosis)   . SCCA (squamous cell carcinoma) of skin 03/30/2019   Past Surgical History:  Procedure Laterality Date  . BREAST BIOPSY Right 2008   CORE - NEG  . COLONOSCOPY  08/11/2014  . LAPAROSCOPY     endometriosis   Family History  Problem Relation Age of Onset  . CAD Father   . Aortic aneurysm Father   . Asthma Father   . Heart failure Mother   . Breast cancer Paternal Aunt    Social History   Socioeconomic History  . Marital status: Widowed    Spouse name: Not on file  . Number of children: Not on file  . Years of education: Not on file  . Highest education level: Not on file  Occupational History  . Occupation: retired  Tobacco Use  . Smoking status: Never Smoker  . Smokeless tobacco: Never Used  Vaping Use  . Vaping Use: Never used  Substance and Sexual Activity  . Alcohol use: Yes    Alcohol/week: 2.0 standard drinks    Types: 2 Standard drinks or equivalent per week    Comment: occasional  . Drug  use: No  . Sexual activity: Yes    Birth control/protection: Post-menopausal, None  Other Topics Concern  . Not on file  Social History Narrative  . Not on file   Social Determinants of Health   Financial Resource Strain: Low Risk   . Difficulty of Paying Living Expenses: Not hard at all  Food Insecurity: No Food Insecurity  . Worried About Charity fundraiser in the Last Year: Never true  . Ran Out of Food in the Last Year: Never true  Transportation Needs: No Transportation Needs  . Lack of Transportation (Medical): No  . Lack of Transportation (Non-Medical): No  Physical Activity:  Inactive  . Days of Exercise per Week: 0 days  . Minutes of Exercise per Session: 0 min  Stress: No Stress Concern Present  . Feeling of Stress : Not at all  Social Connections: Moderately Isolated  . Frequency of Communication with Friends and Family: More than three times a week  . Frequency of Social Gatherings with Friends and Family: Three times a week  . Attends Religious Services: More than 4 times per year  . Active Member of Clubs or Organizations: No  . Attends Archivist Meetings: Never  . Marital Status: Widowed    Tobacco Counseling Counseling given: Not Answered   Clinical Intake:  Pre-visit preparation completed: Yes  Pain : No/denies pain     Nutritional Risks: None Diabetes: No  How often do you need to have someone help you when you read instructions, pamphlets, or other written materials from your doctor or pharmacy?: 1 - Never    Interpreter Needed?: No  Information entered by :: Clemetine Marker LPN   Activities of Daily Living In your present state of health, do you have any difficulty performing the following activities: 06/11/2020 04/08/2020  Hearing? N N  Comment declines hearing aids -  Vision? N N  Difficulty concentrating or making decisions? N N  Walking or climbing stairs? N N  Dressing or bathing? N N  Doing errands, shopping? N N  Preparing Food and eating ? N -  Using the Toilet? N -  In the past six months, have you accidently leaked urine? N -  Do you have problems with loss of bowel control? N -  Managing your Medications? N -  Managing your Finances? N -  Housekeeping or managing your Housekeeping? N -  Some recent data might be hidden    Patient Care Team: Glean Hess, MD as PCP - General (Internal Medicine) Jannet Mantis, MD (Dermatology) Manya Silvas, MD (Inactive) (Gastroenterology) Merril Abbe, MD as Referring Physician (Dermatology)  Indicate any recent Medical Services you may have  received from other than [facility] providers in the past year (date may be approximate).     Assessment:   This is a routine wellness examination for [name].  Hearing/Vision screen  Hearing Screening   125Hz  250Hz  500Hz  1000Hz  2000Hz  3000Hz  4000Hz  6000Hz  8000Hz   Right ear:           Left ear:           Comments: Pt denies hearing difficulty  Vision Screening Comments: Annual vision screenings with [facility] Dr. Michelene Heady  Dietary issues and exercise activities discussed: Current Exercise Habits: The patient does not participate in regular exercise at present, Exercise limited by: None identified  Goals    . Increase physical activity     Pt states she would like to increase physical activity over  the next year.       Depression Screen PHQ 2/9 Scores 06/11/2020 04/08/2020 11/20/2019 11/20/2019 03/30/2019 03/23/2018 04/04/2017  PHQ - 2 Score 0 0 0 0 0 2 2  PHQ- 9 Score - 0 0 - 0 9 2    Fall Risk Fall Risk  06/11/2020 04/08/2020 11/20/2019 03/30/2019 03/23/2018  Falls in the past year? 0 0 0 - -  Number falls in past yr: 0 0 0 0 -  Comment - - - April 2020- Turned L ankle walking -  Injury with Fall? 0 0 0 1 -  Risk for fall due to : No Fall Risks No Fall Risks - - -  Follow up Falls prevention discussed Falls evaluation completed Falls evaluation completed - Follow up appointment    Any stairs in or around the home? Yes  If so, are there any without handrails? No  Home free of loose throw rugs in walkways, pet beds, electrical cords, etc? Yes  Adequate lighting in your home to reduce risk of falls? Yes   ASSISTIVE DEVICES UTILIZED TO PREVENT FALLS:  Life alert? No  Use of a cane, walker or w/c? No  Grab bars in the bathroom? Yes  Shower chair or bench in shower? Yes  Elevated toilet seat or a handicapped toilet? Yes   TIMED UP AND GO:  Was the test performed? No . Telephonic visit.   Cognitive Function: 6CIT deferred for 2021 AWV; pt has no memory issues          Immunizations Immunization History  Administered Date(s) Administered  . Influenza, High Dose Seasonal PF 07/27/2019  . Influenza,inj,Quad PF,6+ Mos 07/17/2017, 07/24/2018  . Influenza-Unspecified 07/14/2015, 07/27/2019  . PFIZER SARS-COV-2 Vaccination 11/23/2019, 12/19/2019  . Pneumococcal Conjugate-13 04/08/2020  . Tdap 08/25/2011  . Zoster 11/18/2014  . Zoster Recombinat (Shingrix) 07/24/2018, 12/01/2018    TDAP status: Up to date   Flu Vaccine status: Up to date   Pneumococcal vaccine status: Up to date   Covid-19 vaccine status: Completed vaccines  Qualifies for Shingles Vaccine? Yes   Zostavax completed Yes   Shingrix Completed?: Yes  Screening Tests Health Maintenance  Topic Date Due  . COLONOSCOPY  08/12/2019  . INFLUENZA VACCINE  05/11/2020  . MAMMOGRAM  03/25/2021  . PNA vac Low Risk Adult (2 of 2 - PPSV23) 04/08/2021  . DEXA SCAN  06/05/2021  . TETANUS/TDAP  08/24/2021  . COVID-19 Vaccine  Completed  . Hepatitis C Screening  Addressed    Health Maintenance  Health Maintenance Due  Topic Date Due  . COLONOSCOPY  08/12/2019  . INFLUENZA VACCINE  05/11/2020    Colorectal cancer screening: Completed 08/25/14.Marland Kitchen Repeat every 5 years. Scheduled for 07/07/20.  Mammogram status: Completed 03/25/20. Repeat every year   Bone Density status: Completed 06/06/19. Results reflect: Bone density results: OSTEOPENIA. Repeat every 2 years.  Lung Cancer Screening: (Low Dose CT Chest recommended if Age 16-80 years, 30 pack-year currently smoking OR have quit w/in 15years.) does not qualify.   Additional Screening:  Hepatitis C Screening: does qualify; Completed 12/14/10  Vision Screening: Recommended annual ophthalmology exams for early detection of glaucoma and other disorders of the eye. Is the patient up to date with their annual eye exam?  Yes  Who is the provider or what is the name of the office in which the patient attends annual eye exams? [facility]  Dental Screening: Recommended annual dental exams for proper oral hygiene  Community Resource Referral / Chronic Care  Management: CRR required this visit?  No   CCM required this visit?  No      Plan:     I have personally reviewed and noted the following in the patient's chart:   . Medical and social history . Use of alcohol, tobacco or illicit drugs  . Current medications and supplements . Functional ability and status . Nutritional status . Physical activity . Advanced directives . List of other physicians . Hospitalizations, surgeries, and ER visits in previous 12 months . Vitals . Screenings to include cognitive, depression, and falls . Referrals and appointments  In addition, I have reviewed and discussed with patient certain preventive protocols, quality metrics, and best practice recommendations. A written personalized care plan for preventive services as well as general preventive health recommendations were provided to patient.     Clemetine Marker, LPN   04/10/5952   Nurse Notes: pt doing well and appreciative of visit today.

## 2020-07-01 ENCOUNTER — Encounter: Payer: Self-pay | Admitting: Gastroenterology

## 2020-07-03 ENCOUNTER — Other Ambulatory Visit
Admission: RE | Admit: 2020-07-03 | Discharge: 2020-07-03 | Disposition: A | Discharge status: Home / Self Care | Payer: Medicare PPO | Source: Ambulatory Visit | Attending: Gastroenterology | Admitting: Gastroenterology

## 2020-07-03 ENCOUNTER — Other Ambulatory Visit: Payer: Self-pay

## 2020-07-03 DIAGNOSIS — Z20822 Contact with and (suspected) exposure to covid-19: Secondary | ICD-10-CM | POA: Insufficient documentation

## 2020-07-03 DIAGNOSIS — Z01812 Encounter for preprocedural laboratory examination: Secondary | ICD-10-CM | POA: Diagnosis present

## 2020-07-03 LAB — SARS CORONAVIRUS 2 (TAT 6-24 HRS): SARS Coronavirus 2: NEGATIVE

## 2020-07-04 ENCOUNTER — Encounter: Payer: Self-pay | Admitting: Gastroenterology

## 2020-07-07 ENCOUNTER — Ambulatory Visit: Payer: Medicare PPO | Admitting: Certified Registered"

## 2020-07-07 ENCOUNTER — Encounter
Admission: RE | Disposition: A | Discharge status: Home / Self Care | Payer: Self-pay | Source: Home / Self Care | Attending: Gastroenterology

## 2020-07-07 ENCOUNTER — Encounter: Payer: Self-pay | Admitting: Gastroenterology

## 2020-07-07 ENCOUNTER — Ambulatory Visit
Admission: RE | Admit: 2020-07-07 | Discharge: 2020-07-07 | Disposition: A | Discharge status: Home / Self Care | Payer: Medicare PPO | Attending: Gastroenterology | Admitting: Gastroenterology

## 2020-07-07 ENCOUNTER — Other Ambulatory Visit: Payer: Self-pay

## 2020-07-07 DIAGNOSIS — Z86010 Personal history of colonic polyps: Secondary | ICD-10-CM

## 2020-07-07 DIAGNOSIS — J45909 Unspecified asthma, uncomplicated: Secondary | ICD-10-CM | POA: Insufficient documentation

## 2020-07-07 DIAGNOSIS — Z1211 Encounter for screening for malignant neoplasm of colon: Secondary | ICD-10-CM | POA: Diagnosis present

## 2020-07-07 DIAGNOSIS — Z79899 Other long term (current) drug therapy: Secondary | ICD-10-CM | POA: Insufficient documentation

## 2020-07-07 DIAGNOSIS — K635 Polyp of colon: Secondary | ICD-10-CM | POA: Diagnosis not present

## 2020-07-07 DIAGNOSIS — Z860101 Personal history of adenomatous and serrated colon polyps: Secondary | ICD-10-CM

## 2020-07-07 DIAGNOSIS — F329 Major depressive disorder, single episode, unspecified: Secondary | ICD-10-CM | POA: Insufficient documentation

## 2020-07-07 DIAGNOSIS — D124 Benign neoplasm of descending colon: Secondary | ICD-10-CM | POA: Diagnosis not present

## 2020-07-07 DIAGNOSIS — F419 Anxiety disorder, unspecified: Secondary | ICD-10-CM | POA: Insufficient documentation

## 2020-07-07 DIAGNOSIS — Z85828 Personal history of other malignant neoplasm of skin: Secondary | ICD-10-CM | POA: Insufficient documentation

## 2020-07-07 HISTORY — PX: COLONOSCOPY WITH PROPOFOL: SHX5780

## 2020-07-07 SURGERY — COLONOSCOPY WITH PROPOFOL
Anesthesia: General

## 2020-07-07 MED ORDER — PROPOFOL 10 MG/ML IV BOLUS
INTRAVENOUS | Status: DC | PRN
  Administered 2020-07-07 (×3): 50 mg via INTRAVENOUS

## 2020-07-07 MED ORDER — PROPOFOL 500 MG/50ML IV EMUL
INTRAVENOUS | Status: DC | PRN
  Administered 2020-07-07: 150 ug/kg/min via INTRAVENOUS

## 2020-07-07 MED ORDER — SODIUM CHLORIDE 0.9 % IV SOLN: INTRAVENOUS | Status: DC

## 2020-07-07 MED ORDER — PROPOFOL 500 MG/50ML IV EMUL
INTRAVENOUS | Status: AC
  Filled 2020-07-07: qty 50

## 2020-07-07 NOTE — Op Note (Signed)
[facility] Gastroenterology Patient Name: Janet Cook Procedure Date: 07/07/2020 8:48 AM MRN: [id number] Account #: [id number] Date of Birth: 09-Apr-1956 Admit Type: Outpatient Age: 64 Room: [facility] ENDO ROOM 1 Gender: Female Note Status: Finalized Procedure:             Colonoscopy Indications:           Surveillance: Personal history of adenomatous polyps                         on last colonoscopy 5 years ago, Last colonoscopy:                         November 2015 Providers:             Lin Landsman MD, MD Medicines:             Monitored Anesthesia Care Complications:         No immediate complications. Estimated blood loss: None. Procedure:             Pre-Anesthesia Assessment:                        - Prior to the procedure, a History and Physical was                         performed, and patient medications and allergies were                         reviewed. The patient is competent. The risks and                         benefits of the procedure and the sedation options and                         risks were discussed with the patient. All questions                         were answered and informed consent was obtained.                         Patient identification and proposed procedure were                         verified by the physician, the nurse, the                         anesthesiologist, the anesthetist and the technician                         in the pre-procedure area in the procedure room in the                         endoscopy suite. Mental Status Examination: alert and                         oriented. Airway Examination: normal oropharyngeal                         airway and neck mobility. Respiratory Examination:  clear to auscultation. CV Examination: normal.                         Prophylactic Antibiotics: The patient does not require                         prophylactic antibiotics. Prior  Anticoagulants: The                         patient has taken no previous anticoagulant or                         antiplatelet agents. ASA Grade Assessment: II - A                         patient with mild systemic disease. After reviewing                         the risks and benefits, the patient was deemed in                         satisfactory condition to undergo the procedure. The                         anesthesia plan was to use monitored anesthesia care                         (MAC). Immediately prior to administration of                         medications, the patient was re-assessed for adequacy                         to receive sedatives. The heart rate, respiratory                         rate, oxygen saturations, blood pressure, adequacy of                         pulmonary ventilation, and response to care were                         monitored throughout the procedure. The physical                         status of the patient was re-assessed after the                         procedure.                        After obtaining informed consent, the colonoscope was                         passed under direct vision. Throughout the procedure,                         the patient's blood pressure, pulse, and oxygen  saturations were monitored continuously. The                         Colonoscope was introduced through the anus and                         advanced to the the cecum, identified by appendiceal                         orifice and ileocecal valve. The colonoscopy was                         performed without difficulty. The patient tolerated                         the procedure well. The quality of the bowel                         preparation was evaluated using the BBPS [facility] Bowel                         Preparation Scale) with scores of: Right Colon = 3,                         Transverse Colon = 3 and Left Colon = 3 (entire mucosa                          seen well with no residual staining, small fragments                         of stool or opaque liquid). The total BBPS score                         equals 9. Findings:      The perianal and digital rectal examinations were normal. Pertinent       negatives include normal sphincter tone and no palpable rectal lesions.      Two sessile polyps were found in the descending colon. The polyps were 5       to 8 mm in size. These polyps were removed with a cold snare. Resection       and retrieval were complete.      The retroflexed view of the distal rectum and anal verge was normal and       showed no anal or rectal abnormalities. Impression:            - Two 5 to 8 mm polyps in the descending colon,                         removed with a cold snare. Resected and retrieved.                        - The distal rectum and anal verge are normal on                         retroflexion view. Recommendation:        - Discharge patient to home (with escort).                        -  Resume previous diet today.                        - Continue present medications.                        - Await pathology results.                        - Repeat colonoscopy in 7 years for surveillance. Procedure Code(s):     --- Professional ---                        [phone], Colonoscopy, flexible; with removal of                         tumor(s), polyp(s), or other lesion(s) by snare                         technique Diagnosis Code(s):     --- Professional ---                        Z86.010, Personal history of colonic polyps                        K63.5, Polyp of colon CPT copyright 2019 American Medical Association. All rights reserved. The codes documented in this report are preliminary and upon coder review may  be revised to meet current compliance requirements. Dr. Ulyess Mort Lin Landsman MD, MD 07/07/2020 9:21:52 AM This report has been signed electronically. Number of Addenda:  0 Note Initiated On: 07/07/2020 8:48 AM Scope Withdrawal Time: 0 hours 12 minutes 16 seconds  Total Procedure Duration: 0 hours 17 minutes 29 seconds  Estimated Blood Loss:  Estimated blood loss: none.      [facility]

## 2020-07-07 NOTE — Transfer of Care (Signed)
Immediate Anesthesia Transfer of Care Note  Patient: Janet Cook  Procedure(s) Performed: COLONOSCOPY WITH PROPOFOL (N/A )  Patient Location: PACU and Endoscopy Unit  Anesthesia Type:General  Level of Consciousness: drowsy  Airway & Oxygen Therapy: Patient Spontanous Breathing  Post-op Assessment: Report given to RN  Post vital signs: stable  Last Vitals:  Vitals Value Taken Time  BP    Temp    Pulse    Resp    SpO2      Last Pain:  Vitals:   07/07/20 0748  TempSrc: Tympanic  PainSc: 0-No pain         Complications: No complications documented.

## 2020-07-07 NOTE — Progress Notes (Signed)
   07/07/20 0750  Clinical Encounter Type  Visited With Family  Visit Type Initial  Referral From Chaplain  Consult/Referral To Chaplain  While rounding SDS waiting area, chaplain briefly visited with pt's boyfriend to see how he was doing and to find out if he had any concerns. He said he was ok and did not have any questions. Chaplain wished him well and left.

## 2020-07-07 NOTE — Anesthesia Preprocedure Evaluation (Addendum)
Anesthesia Evaluation  Patient identified by MRN, date of birth, ID band Patient awake    Reviewed: Allergy & Precautions, NPO status , Patient's Chart, lab work & pertinent test results  History of Anesthesia Complications Negative for: history of anesthetic complications  Airway Mallampati: II       Dental   Pulmonary asthma , neg sleep apnea, Not current smoker,           Cardiovascular (-) hypertension(-) Past MI and (-) CHF (-) dysrhythmias (-) Valvular Problems/Murmurs     Neuro/Psych neg Seizures Anxiety Depression    GI/Hepatic Neg liver ROS, neg GERD  ,  Endo/Other  neg diabetes  Renal/GU negative Renal ROS     Musculoskeletal   Abdominal   Peds  Hematology   Anesthesia Other Findings   Reproductive/Obstetrics                            Anesthesia Physical Anesthesia Plan  ASA: II  Anesthesia Plan: General   Post-op Pain Management:    Induction: Intravenous  PONV Risk Score and Plan: 3 and Propofol infusion, TIVA and Treatment may vary due to age or medical condition  Airway Management Planned: Nasal Cannula  Additional Equipment:   Intra-op Plan:   Post-operative Plan:   Informed Consent: I have reviewed the patients History and Physical, chart, labs and discussed the procedure including the risks, benefits and alternatives for the proposed anesthesia with the patient or authorized representative who has indicated his/her understanding and acceptance.       Plan Discussed with:   Anesthesia Plan Comments:         Anesthesia Quick Evaluation

## 2020-07-07 NOTE — H&P (Signed)
Janet Darby, MD [address]  [place]  [place], [place] [address]  Main: [phone]  Fax: [phone] Pager: [phone]  Primary Care Physician:  Janet Hess, MD Primary Gastroenterologist:  Dr. Cephas Cook  Pre-Procedure History & Physical: HPI:  Janet Cook is a 64 y.o. female is here for an colonoscopy.   Past Medical History:  Diagnosis Date  . Anxiety 05/11/1982  . Asthma 09/20/1983  . Depression 05/11/1982  . Mild hyperlipidemia   . OP (osteoporosis)   . SCCA (squamous cell carcinoma) of skin 03/30/2019    Past Surgical History:  Procedure Laterality Date  . BREAST BIOPSY Right 2008   CORE - NEG  . COLONOSCOPY  08/11/2014  . LAPAROSCOPY     endometriosis    Prior to Admission medications   Medication Sig Start Date End Date Taking? Authorizing Provider  FLUoxetine (PROZAC) 40 MG capsule TAKE 1 CAPSULE BY MOUTH EVERY DAY 07/22/19  Yes Janet Hess, MD  albuterol (VENTOLIN HFA) 108 (90 Base) MCG/ACT inhaler INHALE TWO PUFFS FOUR TIMES DAILY AS NEEDED 01/19/19   Janet Hess, MD  Calcium Carbonate-Vit D-Min (CALTRATE 600+D PLUS PO) Take by mouth.    [provider]  fexofenadine (ALLEGRA) 60 MG tablet Take 60 mg by mouth 2 (two) times daily.    [provider]  traZODone (DESYREL) 50 MG tablet TAKE 1 TABLET BY MOUTH EVERYDAY AT BEDTIME 02/25/20   Janet Hess, MD  valACYclovir (VALTREX) 1000 MG tablet TAKE 2 TABLETS BY MOUTH TWICE A DAY 12/16/19   Janet Hess, MD    Allergies as of 05/13/2020  . (No Known Allergies)    Family History  Problem Relation Age of Onset  . CAD Father   . Aortic aneurysm Father   . Asthma Father   . Heart failure Mother   . Breast cancer Paternal Aunt     Social History   Socioeconomic History  . Marital status: Widowed    Spouse name: Not on file  . Number of children: Not on file  . Years of education: Not on file  . Highest education level: Not on file   Occupational History  . Occupation: retired  Tobacco Use  . Smoking status: Never Smoker  . Smokeless tobacco: Never Used  Vaping Use  . Vaping Use: Never used  Substance and Sexual Activity  . Alcohol use: Yes    Alcohol/week: 2.0 standard drinks    Types: 2 Standard drinks or equivalent per week    Comment: occasional  . Drug use: No  . Sexual activity: Yes    Birth control/protection: Post-menopausal, None  Other Topics Concern  . Not on file  Social History Narrative  . Not on file   Social Determinants of Health   Financial Resource Strain: Low Risk   . Difficulty of Paying Living Expenses: Not hard at all  Food Insecurity: No Food Insecurity  . Worried About Charity fundraiser in the Last Year: Never true  . Ran Out of Food in the Last Year: Never true  Transportation Needs: No Transportation Needs  . Lack of Transportation (Medical): No  . Lack of Transportation (Non-Medical): No  Physical Activity: Inactive  . Days of Exercise per Week: 0 days  . Minutes of Exercise per Session: 0 min  Stress: No Stress Concern Present  . Feeling of Stress : Not at all  Social Connections: Moderately Isolated  . Frequency of Communication with Friends and Family:  More than three times a week  . Frequency of Social Gatherings with Friends and Family: Three times a week  . Attends Religious Services: More than 4 times per year  . Active Member of Clubs or Organizations: No  . Attends Archivist Meetings: Never  . Marital Status: Widowed  Intimate Partner Violence: Not At Risk  . Fear of Current or Ex-Partner: No  . Emotionally Abused: No  . Physically Abused: No  . Sexually Abused: No    Review of Systems: See HPI, otherwise negative ROS  Physical Exam: BP (!) 148/88   Pulse 82   Temp 98.1 F (36.7 C) (Tympanic)   Resp 18   Ht 5\' 4"  (1.626 m)   Wt 69.4 kg   SpO2 99%   BMI 26.26 kg/m  General:   Alert,  pleasant and cooperative in NAD Head:   Normocephalic and atraumatic. Neck:  Supple; no masses or thyromegaly. Lungs:  Clear throughout to auscultation.    Heart:  Regular rate and rhythm. Abdomen:  Soft, nontender and nondistended. Normal bowel sounds, without guarding, and without rebound.   Neurologic:  Alert and  oriented x4;  grossly normal neurologically.  Impression/Plan: Janet Cook is here for an colonoscopy to be performed for h/o colon polyp  Risks, benefits, limitations, and alternatives regarding  colonoscopy have been reviewed with the patient.  Questions have been answered.  All parties agreeable.   Sherri Sear, MD  07/07/2020, 8:57 AM

## 2020-07-07 NOTE — Anesthesia Postprocedure Evaluation (Signed)
Anesthesia Post Note  Patient: Janet Cook  Procedure(s) Performed: COLONOSCOPY WITH PROPOFOL (N/A )  Patient location during evaluation: Endoscopy Anesthesia Type: General Level of consciousness: awake and alert Pain management: pain level controlled Vital Signs Assessment: post-procedure vital signs reviewed and stable Respiratory status: spontaneous breathing and respiratory function stable Cardiovascular status: stable Anesthetic complications: no   No complications documented.   Last Vitals:  Vitals:   07/07/20 0924 07/07/20 1004  BP:  125/65  Pulse:    Resp:    Temp: (!) 36.3 C   SpO2:      Last Pain:  Vitals:   07/07/20 0954  TempSrc:   PainSc: 0-No pain                 Favio Moder K

## 2020-07-08 ENCOUNTER — Encounter: Payer: Self-pay | Admitting: Gastroenterology

## 2020-07-08 LAB — SURGICAL PATHOLOGY

## 2020-07-09 ENCOUNTER — Encounter: Payer: Self-pay | Admitting: Gastroenterology

## 2020-08-04 ENCOUNTER — Other Ambulatory Visit: Payer: Self-pay | Admitting: Internal Medicine

## 2020-08-04 NOTE — Telephone Encounter (Signed)
Requested medication (s) are due for refill today: Yes  Requested medication (s) are on the active medication list: Yes  Last refill:  07/22/19  Future visit scheduled: Yes  Notes to clinic:  Prescription has expired.    Requested Prescriptions  Pending Prescriptions Disp Refills   FLUoxetine (PROZAC) 40 MG capsule [Pharmacy Med Name: FLUOXETINE HCL 40 MG CAPSULE] 90 capsule 3    Sig: TAKE 1 CAPSULE BY MOUTH EVERY DAY      Psychiatry:  Antidepressants - SSRI Passed - 08/04/2020  1:13 AM      Passed - Completed PHQ-2 or PHQ-9 in the last 360 days.      Passed - Valid encounter within last 6 months    Recent Outpatient Visits           3 months ago Annual physical exam   [facility] Glean Hess, MD   8 months ago BV (bacterial vaginosis)   [facility] Glean Hess, MD   1 year ago Annual physical exam   [facility] Glean Hess, MD   2 years ago Annual physical exam   [facility] Glean Hess, MD   3 years ago Annual physical exam   [facility] Glean Hess, MD       Future Appointments             In 1 month Army Melia Jesse Sans, MD [facility], [place]   In 8 months Army Melia, Jesse Sans, MD [facility], [facility]

## 2020-08-07 ENCOUNTER — Other Ambulatory Visit: Payer: Self-pay | Admitting: Internal Medicine

## 2020-08-19 ENCOUNTER — Ambulatory Visit: Payer: Medicare PPO

## 2020-09-18 ENCOUNTER — Other Ambulatory Visit: Payer: Self-pay

## 2020-09-18 ENCOUNTER — Ambulatory Visit (INDEPENDENT_AMBULATORY_CARE_PROVIDER_SITE_OTHER): Payer: Medicare PPO | Admitting: Internal Medicine

## 2020-09-18 ENCOUNTER — Encounter: Payer: Self-pay | Admitting: Internal Medicine

## 2020-09-18 VITALS — BP 128/80 | HR 71 | Temp 97.9°F | Ht 64.0 in | Wt 153.0 lb

## 2020-09-18 DIAGNOSIS — F339 Major depressive disorder, recurrent, unspecified: Secondary | ICD-10-CM

## 2020-09-18 DIAGNOSIS — J4520 Mild intermittent asthma, uncomplicated: Secondary | ICD-10-CM | POA: Diagnosis not present

## 2020-09-18 NOTE — Progress Notes (Signed)
Date:  09/18/2020   Name:  Janet Cook   DOB:  11/06/1955   MRN:  [id number]   Chief Complaint: Depression  Depression        This is a chronic problem.  The problem has been resolved since onset.  Associated symptoms include no fatigue, no appetite change and no headaches.  Past treatments include SSRIs - Selective serotonin reuptake inhibitors and other medications. Shortness of Breath This is a chronic problem. The problem has been unchanged. The average episode lasts 30 seconds (gets winded with extreme exertion but resolves quickly with rest). Pertinent negatives include no abdominal pain, chest pain, claudication, fever, headaches, leg swelling, syncope or wheezing. Exacerbated by: walking up stairs or steep inclines. The patient has no known risk factors for DVT/PE. She has tried beta agonist inhalers (before exercise) for the symptoms.    Lab Results  Component Value Date   CREATININE 0.84 04/08/2020   BUN 16 04/08/2020   NA 140 04/08/2020   K 4.8 04/08/2020   CL 100 04/08/2020   CO2 26 04/08/2020   Lab Results  Component Value Date   CHOL 212 (H) 04/08/2020   HDL 59 04/08/2020   LDLCALC 125 (H) 04/08/2020   TRIG 160 (H) 04/08/2020   CHOLHDL 3.6 04/08/2020   Lab Results  Component Value Date   TSH 1.110 04/08/2020   No results found for: HGBA1C Lab Results  Component Value Date   WBC 7.5 04/08/2020   HGB 15.3 04/08/2020   HCT 45.2 04/08/2020   MCV 92 04/08/2020   PLT 313 04/08/2020   Lab Results  Component Value Date   ALT 21 04/08/2020   AST 20 04/08/2020   ALKPHOS 64 04/08/2020   BILITOT 0.4 04/08/2020     Review of Systems  Constitutional: Negative for appetite change, fatigue, fever and unexpected weight change.  HENT: Negative for tinnitus and trouble swallowing.   Eyes: Negative for visual disturbance.  Respiratory: Positive for shortness of breath. Negative for cough, chest tightness and wheezing.   Cardiovascular: Negative for chest  pain, palpitations, claudication, leg swelling and syncope.  Gastrointestinal: Negative for abdominal pain.  Endocrine: Negative for polydipsia and polyuria.  Genitourinary: Negative for dysuria and hematuria.  Musculoskeletal: Negative for arthralgias.  Neurological: Negative for dizziness, tremors, numbness and headaches.  Psychiatric/Behavioral: Positive for depression. Negative for dysphoric mood.    Patient Active Problem List   Diagnosis Date Noted  . Hx of adenomatous colonic polyps   . Mild hyperlipidemia 03/31/2019  . SCCA (squamous cell carcinoma) of skin 03/30/2019  . Asthma, mild intermittent 08/11/2015  . Allergic state 08/11/2015  . Genital herpes simplex type 2 08/11/2015  . Chronic recurrent major depressive disorder ([place]) 08/11/2015  . OP (osteoporosis) 08/11/2015  . Idiopathic insomnia 08/11/2015  . Avitaminosis D 08/11/2015    No Known Allergies  Past Surgical History:  Procedure Laterality Date  . BREAST BIOPSY Right 2008   CORE - NEG  . COLONOSCOPY  08/11/2014  . COLONOSCOPY WITH PROPOFOL N/A 07/07/2020   Procedure: COLONOSCOPY WITH PROPOFOL;  Surgeon: Lin Landsman, MD;  Location: [facility] ENDOSCOPY;  Service: Gastroenterology;  Laterality: N/A;  . LAPAROSCOPY     endometriosis    Social History   Tobacco Use  . Smoking status: Never Smoker  . Smokeless tobacco: Never Used  Vaping Use  . Vaping Use: Never used  Substance Use Topics  . Alcohol use: Yes    Alcohol/week: 2.0 standard drinks    Types:  2 Standard drinks or equivalent per week    Comment: occasional  . Drug use: No     Medication list has been reviewed and updated.  Current Meds  Medication Sig  . albuterol (VENTOLIN HFA) 108 (90 Base) MCG/ACT inhaler INHALE TWO PUFFS FOUR TIMES DAILY AS NEEDED  . Calcium Carbonate-Vit D-Min (CALTRATE 600+D PLUS PO) Take by mouth.  . fexofenadine (ALLEGRA) 60 MG tablet Take 60 mg by mouth 2 (two) times daily.  Marland Kitchen FLUoxetine (PROZAC) 40 MG  capsule TAKE 1 CAPSULE BY MOUTH EVERY DAY  . traZODone (DESYREL) 50 MG tablet TAKE 1 TABLET BY MOUTH EVERYDAY AT BEDTIME  . valACYclovir (VALTREX) 1000 MG tablet TAKE 2 TABLETS BY MOUTH TWICE A DAY    PHQ 2/9 Scores 09/18/2020 06/11/2020 04/08/2020 11/20/2019  PHQ - 2 Score 0 0 0 0  PHQ- 9 Score 0 - 0 0    GAD 7 : Generalized Anxiety Score 09/18/2020 04/08/2020  Nervous, Anxious, on Edge 0 0  Control/stop worrying 0 0  Worry too much - different things 0 0  Trouble relaxing 0 0  Restless 0 0  Easily annoyed or irritable 0 0  Afraid - awful might happen 0 0  Total GAD 7 Score 0 0  Anxiety Difficulty - Not difficult at all    BP Readings from Last 3 Encounters:  09/18/20 128/80  07/07/20 125/65  04/08/20 102/68    Physical Exam Vitals and nursing note reviewed.  Constitutional:      General: She is not in acute distress.    Appearance: Normal appearance. She is well-developed.  HENT:     Head: Normocephalic and atraumatic.  Neck:     Vascular: No carotid bruit.  Cardiovascular:     Rate and Rhythm: Normal rate and regular rhythm.     Pulses: Normal pulses.     Heart sounds: No murmur heard.   Pulmonary:     Effort: Pulmonary effort is normal. No respiratory distress.     Breath sounds: No wheezing or rhonchi.  Musculoskeletal:     Cervical back: Normal range of motion.     Right lower leg: No edema.     Left lower leg: No edema.  Lymphadenopathy:     Cervical: No cervical adenopathy.  Skin:    General: Skin is warm and dry.     Capillary Refill: Capillary refill takes less than 2 seconds.     Findings: No rash.  Neurological:     General: No focal deficit present.     Mental Status: She is alert and oriented to person, place, and time.  Psychiatric:        Attention and Perception: Attention normal.        Mood and Affect: Mood and affect and mood normal.        Behavior: Behavior normal.        Thought Content: Thought content does not include suicidal ideation.  Thought content does not include suicidal plan.     Wt Readings from Last 3 Encounters:  09/18/20 153 lb (69.4 kg)  07/07/20 153 lb (69.4 kg)  04/08/20 169 lb (76.7 kg)    BP 128/80   Pulse 71   Temp 97.9 F (36.6 C) (Oral)   Ht 5\' 4"  (1.626 m)   Wt 153 lb (69.4 kg)   SpO2 97%   BMI 26.26 kg/m   Assessment and Plan: 1. Chronic recurrent major depressive disorder (HCC) Clinically stable on current regimen with good control of  symptoms, No SI or HI. Will continue current therapy.  2. Mild intermittent asthma without complication Continue to use albuterol before exercise Suspect SOB is due to deconditioning - recommend she increase her exercise duration and intensity gradually - if this is not tolerated, or new symptoms develop,  return for further evaluation   Partially dictated using Dragon software. Any errors are unintentional.  Halina Maidens, MD [facility]  09/18/2020

## 2020-10-20 ENCOUNTER — Other Ambulatory Visit: Payer: Self-pay | Admitting: Internal Medicine

## 2020-11-02 ENCOUNTER — Other Ambulatory Visit: Payer: Self-pay | Admitting: Internal Medicine

## 2020-11-20 DIAGNOSIS — F418 Other specified anxiety disorders: Secondary | ICD-10-CM | POA: Diagnosis not present

## 2020-12-11 DIAGNOSIS — F418 Other specified anxiety disorders: Secondary | ICD-10-CM | POA: Diagnosis not present

## 2021-02-27 DIAGNOSIS — F418 Other specified anxiety disorders: Secondary | ICD-10-CM | POA: Diagnosis not present

## 2021-04-10 ENCOUNTER — Other Ambulatory Visit: Payer: Self-pay | Admitting: Internal Medicine

## 2021-04-10 ENCOUNTER — Ambulatory Visit (INDEPENDENT_AMBULATORY_CARE_PROVIDER_SITE_OTHER): Payer: Medicare PPO | Admitting: Internal Medicine

## 2021-04-10 ENCOUNTER — Encounter: Payer: Self-pay | Admitting: Internal Medicine

## 2021-04-10 ENCOUNTER — Other Ambulatory Visit: Payer: Self-pay

## 2021-04-10 VITALS — BP 110/72 | HR 74 | Ht 64.0 in | Wt 147.0 lb

## 2021-04-10 DIAGNOSIS — F339 Major depressive disorder, recurrent, unspecified: Secondary | ICD-10-CM | POA: Diagnosis not present

## 2021-04-10 DIAGNOSIS — Z1231 Encounter for screening mammogram for malignant neoplasm of breast: Secondary | ICD-10-CM

## 2021-04-10 DIAGNOSIS — M810 Age-related osteoporosis without current pathological fracture: Secondary | ICD-10-CM

## 2021-04-10 DIAGNOSIS — E785 Hyperlipidemia, unspecified: Secondary | ICD-10-CM | POA: Diagnosis not present

## 2021-04-10 DIAGNOSIS — Z0000 Encounter for general adult medical examination without abnormal findings: Secondary | ICD-10-CM

## 2021-04-10 DIAGNOSIS — F5101 Primary insomnia: Secondary | ICD-10-CM | POA: Diagnosis not present

## 2021-04-10 DIAGNOSIS — Z23 Encounter for immunization: Secondary | ICD-10-CM

## 2021-04-10 MED ORDER — VALACYCLOVIR HCL 1 G PO TABS
2000.0000 mg | ORAL_TABLET | Freq: Two times a day (BID) | ORAL | 0 refills | Status: DC
Start: 2021-04-10 — End: 2021-11-25

## 2021-04-10 NOTE — Progress Notes (Signed)
Date:  04/10/2021   Name:  Janet Cook   DOB:  Feb 27, 1956   MRN:  [id number]   Chief Complaint: Annual Exam (Breast exam no pap) Janet Cook is a 65 y.o. female who presents today for her Complete Annual Exam. She feels well. She reports exercising walking 2 miles x3 times a week . She reports she is sleeping well. Breast complaints none.  She recently re-married.  Mammogram: 03/2020 [facility] DEXA: 05/2019 osteopenia hip/normal spine Pap smear: discontinued Colonoscopy: 06/2020  Immunization History  Administered Date(s) Administered   Fluad Quad(high Dose 65+) 07/13/2020   Influenza, High Dose Seasonal PF 07/27/2019   Influenza,inj,Quad PF,6+ Mos 07/17/2017, 07/24/2018   Influenza-Unspecified 07/14/2015, 07/27/2019   PFIZER(Purple Top)SARS-COV-2 Vaccination 11/23/2019, 12/19/2019, 08/11/2020, 02/01/2021   Pneumococcal Conjugate-13 04/08/2020   Tdap 08/25/2011   Zoster Recombinat (Shingrix) 07/24/2018, 12/01/2018   Zoster, Live 11/18/2014    Depression        This is a chronic problem.  The problem has been resolved since onset.  Associated symptoms include insomnia.  Associated symptoms include no fatigue and no headaches.  Past treatments include SSRIs - Selective serotonin reuptake inhibitors. Insomnia Primary symptoms: no sleep disturbance.   The problem occurs nightly. Past treatments include medication (trazodone). The treatment provided significant relief. PMH includes: depression.    Lab Results  Component Value Date   CREATININE 0.84 04/08/2020   BUN 16 04/08/2020   NA 140 04/08/2020   K 4.8 04/08/2020   CL 100 04/08/2020   CO2 26 04/08/2020   Lab Results  Component Value Date   CHOL 212 (H) 04/08/2020   HDL 59 04/08/2020   LDLCALC 125 (H) 04/08/2020   TRIG 160 (H) 04/08/2020   CHOLHDL 3.6 04/08/2020   Lab Results  Component Value Date   TSH 1.110 04/08/2020   No results found for: HGBA1C Lab Results  Component Value Date   WBC 7.5 04/08/2020   HGB  15.3 04/08/2020   HCT 45.2 04/08/2020   MCV 92 04/08/2020   PLT 313 04/08/2020   Lab Results  Component Value Date   ALT 21 04/08/2020   AST 20 04/08/2020   ALKPHOS 64 04/08/2020   BILITOT 0.4 04/08/2020  Last vitamin D No results found for: 25OHVITD2, 25OHVITD3, VD25OH    Review of Systems  Constitutional:  Negative for chills, fatigue and fever.  HENT:  Negative for congestion, hearing loss, tinnitus, trouble swallowing and voice change.   Eyes:  Negative for visual disturbance.  Respiratory:  Negative for cough, chest tightness, shortness of breath and wheezing.   Cardiovascular:  Negative for chest pain, palpitations and leg swelling.  Gastrointestinal:  Negative for abdominal pain, constipation, diarrhea and vomiting.  Endocrine: Negative for polydipsia and polyuria.  Genitourinary:  Negative for dysuria, frequency, genital sores, vaginal bleeding and vaginal discharge.  Musculoskeletal:  Negative for arthralgias, gait problem and joint swelling.  Skin:  Negative for color change and rash.  Neurological:  Negative for dizziness, tremors, light-headedness and headaches.  Hematological:  Negative for adenopathy. Does not bruise/bleed easily.  Psychiatric/Behavioral:  Positive for depression. Negative for dysphoric mood and sleep disturbance. The patient has insomnia. The patient is not nervous/anxious.    Patient Active Problem List   Diagnosis Date Noted   Hx of adenomatous colonic polyps    Mild hyperlipidemia 03/31/2019   SCCA (squamous cell carcinoma) of skin 03/30/2019   Asthma, mild intermittent 08/11/2015   Allergic state 08/11/2015   Genital herpes simplex type 2  08/11/2015   Chronic recurrent major depressive disorder ([place]) 08/11/2015   OP (osteoporosis) 08/11/2015   Idiopathic insomnia 08/11/2015   Avitaminosis D 08/11/2015    No Known Allergies  Past Surgical History:  Procedure Laterality Date   BREAST BIOPSY Right 2008   CORE - NEG   COLONOSCOPY   08/11/2014   COLONOSCOPY WITH PROPOFOL N/A 07/07/2020   Procedure: COLONOSCOPY WITH PROPOFOL;  Surgeon: Lin Landsman, MD;  Location: [facility];  Service: Gastroenterology;  Laterality: N/A;   LAPAROSCOPY     endometriosis    Social History   Tobacco Use   Smoking status: Never   Smokeless tobacco: Never  Vaping Use   Vaping Use: Never used  Substance Use Topics   Alcohol use: Yes    Alcohol/week: 2.0 standard drinks    Types: 2 Standard drinks or equivalent per week    Comment: occasional   Drug use: No     Medication list has been reviewed and updated.  No outpatient medications have been marked as taking for the 04/10/21 encounter (Office Visit) with Glean Hess, MD.    [facility] 2/9 Scores 04/10/2021 04/10/2021 09/18/2020 06/11/2020  PHQ - 2 Score 0 0 0 0  PHQ- 9 Score 1 1 0 -    GAD 7 : Generalized Anxiety Score 04/10/2021 04/10/2021 09/18/2020 04/08/2020  Nervous, Anxious, on Edge 0 0 0 0  Control/stop worrying 0 0 0 0  Worry too much - different things 0 0 0 0  Trouble relaxing 0 0 0 0  Restless 0 0 0 0  Easily annoyed or irritable 0 0 0 0  Afraid - awful might happen 0 0 0 0  Total GAD 7 Score 0 0 0 0  Anxiety Difficulty Not difficult at all - - Not difficult at all    BP Readings from Last 3 Encounters:  04/10/21 110/72  09/18/20 128/80  07/07/20 125/65    Physical Exam Vitals and nursing note reviewed.  Constitutional:      General: She is not in acute distress.    Appearance: She is well-developed.  HENT:     Head: Normocephalic and atraumatic.     Right Ear: Tympanic membrane and ear canal normal.     Left Ear: Tympanic membrane and ear canal normal.     Nose:     Right Sinus: No maxillary sinus tenderness.     Left Sinus: No maxillary sinus tenderness.  Eyes:     General: No scleral icterus.       Right eye: No discharge.        Left eye: No discharge.     Conjunctiva/sclera: Conjunctivae normal.  Neck:     Thyroid: No thyromegaly.      Vascular: No carotid bruit.  Cardiovascular:     Rate and Rhythm: Normal rate and regular rhythm.     Pulses: Normal pulses.     Heart sounds: Normal heart sounds.  Pulmonary:     Effort: Pulmonary effort is normal. No respiratory distress.     Breath sounds: No wheezing.  Chest:  Breasts:    Right: No mass, nipple discharge, skin change or tenderness.     Left: No mass, nipple discharge, skin change or tenderness.  Abdominal:     General: Bowel sounds are normal.     Palpations: Abdomen is soft.     Tenderness: There is no abdominal tenderness.  Musculoskeletal:     Cervical back: Normal range of motion. No erythema.  Right lower leg: No edema.     Left lower leg: No edema.  Lymphadenopathy:     Cervical: No cervical adenopathy.  Skin:    General: Skin is warm and dry.     Findings: No rash.  Neurological:     Mental Status: She is alert and oriented to person, place, and time.     Cranial Nerves: No cranial nerve deficit.     Sensory: No sensory deficit.     Deep Tendon Reflexes: Reflexes are normal and symmetric.  Psychiatric:        Attention and Perception: Attention normal.        Mood and Affect: Mood normal.    Wt Readings from Last 3 Encounters:  04/10/21 147 lb (66.7 kg)  09/18/20 153 lb (69.4 kg)  07/07/20 153 lb (69.4 kg)    BP 110/72   Pulse 74   Ht 5\' 4"  (1.626 m)   Wt 147 lb (66.7 kg)   SpO2 100%   BMI 25.23 kg/m   Assessment and Plan: 1. Annual physical exam Normal exam; continue healthy diet and exercise Screenings are up to date - CBC with Differential/Platelet - Comprehensive metabolic panel - TSH  2. Encounter for screening mammogram for breast cancer Schedule at [facility] - MM 3D SCREEN BREAST BILATERAL; Future  3. Chronic recurrent major depressive disorder (HCC) Clinically stable on current regimen with good control of symptoms, No SI or HI. Will continue current therapy.  4. Mild hyperlipidemia - Lipid panel  5. Idiopathic  insomnia On trazodone and doing well.  6. Age-related osteoporosis without current pathological fracture Recommend DEXA next year Continue Caltrate - check Vitamin D level - VITAMIN D 25 Hydroxy (Vit-D Deficiency, Fractures)  7. Need for vaccination for pneumococcus - Pneumococcal polysaccharide vaccine 23-valent greater than or equal to 2yo subcutaneous/IM   Partially dictated using Editor, commissioning. Any errors are unintentional.  Halina Maidens, MD [facility]  04/10/2021

## 2021-04-11 LAB — COMPREHENSIVE METABOLIC PANEL
ALT: 17 IU/L (ref 0–32)
AST: 22 IU/L (ref 0–40)
Albumin/Globulin Ratio: 2.2 (ref 1.2–2.2)
Albumin: 4.7 g/dL (ref 3.8–4.8)
Alkaline Phosphatase: 62 IU/L (ref 44–121)
BUN/Creatinine Ratio: 16 (ref 12–28)
BUN: 14 mg/dL (ref 8–27)
Bilirubin Total: 0.4 mg/dL (ref 0.0–1.2)
CO2: 21 mmol/L (ref 20–29)
Calcium: 9.6 mg/dL (ref 8.7–10.3)
Chloride: 101 mmol/L (ref 96–106)
Creatinine, Ser: 0.86 mg/dL (ref 0.57–1.00)
Globulin, Total: 2.1 g/dL (ref 1.5–4.5)
Glucose: 90 mg/dL (ref 65–99)
Potassium: 4.4 mmol/L (ref 3.5–5.2)
Sodium: 139 mmol/L (ref 134–144)
Total Protein: 6.8 g/dL (ref 6.0–8.5)
eGFR: 74 mL/min/{1.73_m2} (ref >59)

## 2021-04-11 LAB — CBC WITH DIFFERENTIAL/PLATELET
Basophils Absolute: 0 10*3/uL (ref 0.0–0.2)
Basos: 1 %
EOS (ABSOLUTE): 0.2 10*3/uL (ref 0.0–0.4)
Eos: 3 %
Hematocrit: 44.5 % (ref 34.0–46.6)
Hemoglobin: 14.8 g/dL (ref 11.1–15.9)
Immature Grans (Abs): 0 10*3/uL (ref 0.0–0.1)
Immature Granulocytes: 0 %
Lymphocytes Absolute: 2.2 10*3/uL (ref 0.7–3.1)
Lymphs: 31 %
MCH: 29.9 pg (ref 26.6–33.0)
MCHC: 33.3 g/dL (ref 31.5–35.7)
MCV: 90 fL (ref 79–97)
Monocytes Absolute: 0.6 10*3/uL (ref 0.1–0.9)
Monocytes: 9 %
Neutrophils Absolute: 4.1 10*3/uL (ref 1.4–7.0)
Neutrophils: 56 %
Platelets: 302 10*3/uL (ref 150–450)
RBC: 4.95 x10E6/uL (ref 3.77–5.28)
RDW: 12.6 % (ref 11.7–15.4)
WBC: 7.2 10*3/uL (ref 3.4–10.8)

## 2021-04-11 LAB — LIPID PANEL
Chol/HDL Ratio: 3.1 ratio (ref 0.0–4.4)
Cholesterol, Total: 196 mg/dL (ref 100–199)
HDL: 64 mg/dL (ref >39)
LDL Chol Calc (NIH): 114 mg/dL — ABNORMAL HIGH (ref 0–99)
Triglycerides: 103 mg/dL (ref 0–149)
VLDL Cholesterol Cal: 18 mg/dL (ref 5–40)

## 2021-04-11 LAB — TSH: TSH: 0.901 u[IU]/mL (ref 0.450–4.500)

## 2021-04-11 LAB — VITAMIN D 25 HYDROXY (VIT D DEFICIENCY, FRACTURES): Vit D, 25-Hydroxy: 44.2 ng/mL (ref 30.0–100.0)

## 2021-04-15 ENCOUNTER — Ambulatory Visit
Admission: RE | Admit: 2021-04-15 | Discharge: 2021-04-15 | Disposition: A | Discharge status: Home / Self Care | Payer: Medicare PPO | Source: Ambulatory Visit | Attending: Internal Medicine | Admitting: Internal Medicine

## 2021-04-15 ENCOUNTER — Other Ambulatory Visit: Payer: Self-pay

## 2021-04-15 DIAGNOSIS — Z1231 Encounter for screening mammogram for malignant neoplasm of breast: Secondary | ICD-10-CM | POA: Diagnosis not present

## 2021-04-17 ENCOUNTER — Other Ambulatory Visit: Payer: Self-pay | Admitting: Internal Medicine

## 2021-04-20 DIAGNOSIS — Z0100 Encounter for examination of eyes and vision without abnormal findings: Secondary | ICD-10-CM | POA: Diagnosis not present

## 2021-04-20 DIAGNOSIS — H2513 Age-related nuclear cataract, bilateral: Secondary | ICD-10-CM | POA: Diagnosis not present

## 2021-04-21 ENCOUNTER — Telehealth: Payer: Self-pay

## 2021-04-21 NOTE — Telephone Encounter (Signed)
Called patient and left VM asking her to get her labs done as soon as possible.   Told her to call back with nay questions.

## 2021-04-21 NOTE — Telephone Encounter (Signed)
Pt stated she already came in and had her bloodwork done, please advise if the pt needs to come back  for more labwork.

## 2021-04-21 NOTE — Telephone Encounter (Signed)
Called pt let her know that she does not need to come in for more labs. Pt did get labs and was already read the results and verbalized understanding.  KP

## 2021-04-30 ENCOUNTER — Other Ambulatory Visit: Payer: Self-pay | Admitting: Internal Medicine

## 2021-04-30 NOTE — Telephone Encounter (Signed)
Requested Prescriptions  Pending Prescriptions Disp Refills  . traZODone (DESYREL) 50 MG tablet [Pharmacy Med Name: TRAZODONE 50 MG TABLET] 90 tablet 1    Sig: TAKE 1 TABLET BY MOUTH EVERYDAY AT BEDTIME     Psychiatry: Antidepressants - Serotonin Modulator Passed - 04/30/2021  4:07 AM      Passed - Completed PHQ-2 or PHQ-9 in the last 360 days      Passed - Valid encounter within last 6 months    Recent Outpatient Visits          2 weeks ago Annual physical exam   [facility] Glean Hess, MD   7 months ago Chronic recurrent major depressive disorder [facility])   [facility] Glean Hess, MD   1 year ago Annual physical exam   [facility] Glean Hess, MD   1 year ago BV (bacterial vaginosis)   [facility] Glean Hess, MD   2 years ago Annual physical exam   [facility] Glean Hess, MD      Future Appointments            In 11 months Army Melia Jesse Sans, MD [facility], [place]           . FLUoxetine (PROZAC) 40 MG capsule [Pharmacy Med Name: FLUOXETINE HCL 40 MG CAPSULE] 90 capsule 1    Sig: TAKE 1 CAPSULE BY MOUTH EVERY DAY     Psychiatry:  Antidepressants - SSRI Passed - 04/30/2021  4:07 AM      Passed - Completed PHQ-2 or PHQ-9 in the last 360 days      Passed - Valid encounter within last 6 months    Recent Outpatient Visits          2 weeks ago Annual physical exam   [facility] Glean Hess, MD   7 months ago Chronic recurrent major depressive disorder [facility])   [facility] Glean Hess, MD   1 year ago Annual physical exam   [facility] Glean Hess, MD   1 year ago BV (bacterial vaginosis)   [facility] Glean Hess, MD   2 years ago Annual physical exam   [facility] Glean Hess, MD      Future Appointments            In 11 months Army Melia Jesse Sans, MD [facility], [facility]

## 2021-05-29 DIAGNOSIS — D485 Neoplasm of uncertain behavior of skin: Secondary | ICD-10-CM | POA: Diagnosis not present

## 2021-05-29 DIAGNOSIS — C4491 Basal cell carcinoma of skin, unspecified: Secondary | ICD-10-CM

## 2021-05-29 DIAGNOSIS — L237 Allergic contact dermatitis due to plants, except food: Secondary | ICD-10-CM | POA: Diagnosis not present

## 2021-05-29 DIAGNOSIS — C44519 Basal cell carcinoma of skin of other part of trunk: Secondary | ICD-10-CM | POA: Diagnosis not present

## 2021-05-29 HISTORY — DX: Basal cell carcinoma of skin, unspecified: C44.91

## 2021-06-09 DIAGNOSIS — C44519 Basal cell carcinoma of skin of other part of trunk: Secondary | ICD-10-CM | POA: Diagnosis not present

## 2021-06-17 ENCOUNTER — Ambulatory Visit: Payer: Medicare Other

## 2021-06-25 DIAGNOSIS — F418 Other specified anxiety disorders: Secondary | ICD-10-CM | POA: Diagnosis not present

## 2021-07-08 ENCOUNTER — Ambulatory Visit (INDEPENDENT_AMBULATORY_CARE_PROVIDER_SITE_OTHER): Payer: Medicare PPO

## 2021-07-08 DIAGNOSIS — Z0000 Encounter for general adult medical examination without abnormal findings: Secondary | ICD-10-CM

## 2021-07-08 NOTE — Progress Notes (Signed)
Subjective:   Janet Cook is a 65 y.o. female who presents for Medicare Annual (Subsequent) preventive examination.  Virtual Visit via Telephone Note  I connected with  Janet Cook on 07/08/21 at  8:00 AM EDT by telephone and verified that I am speaking with the correct person using two identifiers.  Location: Patient: home Provider: [facility] Persons participating in the virtual visit: Janet Cook   I discussed the limitations, risks, security and privacy concerns of performing an evaluation and management service by telephone and the availability of in person appointments. The patient expressed understanding and agreed to proceed.  Interactive audio and video telecommunications were attempted between this nurse and patient, however failed, due to patient having technical difficulties OR patient did not have access to video capability.  We continued and completed visit with audio only.  Some vital signs may be absent or patient reported.   Janet Marker, LPN   Review of Systems     Cardiac Risk Factors include: advanced age (>78men, >106 women)     Objective:    There were no vitals filed for this visit. There is no height or weight on file to calculate BMI.  Advanced Directives 07/08/2021 07/07/2020 06/11/2020 01/07/2016  Does Patient Have a Medical Advance Directive? Yes Yes Yes No  Type of Paramedic of [place];Living will - [place];Living will -  Copy of [place] in Chart? No - copy requested - No - copy requested -  Would patient like information on creating a medical advance directive? - - - No - patient declined information    Current Medications (verified) Outpatient Encounter Medications as of 07/08/2021  Medication Sig   albuterol (VENTOLIN HFA) 108 (90 Base) MCG/ACT inhaler INHALE TWO PUFFS FOUR TIMES DAILY AS NEEDED   Calcium Carbonate-Vit D-Min (CALTRATE 600+D PLUS PO) Take by  mouth.   fexofenadine (ALLEGRA) 60 MG tablet Take 60 mg by mouth 2 (two) times daily.   FLUoxetine (PROZAC) 40 MG capsule TAKE 1 CAPSULE BY MOUTH EVERY DAY   traZODone (DESYREL) 50 MG tablet TAKE 1 TABLET BY MOUTH EVERYDAY AT BEDTIME   valACYclovir (VALTREX) 1000 MG tablet Take 2 tablets (2,000 mg total) by mouth 2 (two) times daily.   No facility-administered encounter medications on file as of 07/08/2021.    Allergies (verified) Patient has no known allergies.   History: Past Medical History:  Diagnosis Date   Anxiety 05/11/1982   Asthma 09/20/1983   Basal cell carcinoma 05/29/2021   back   Depression 05/11/1982   Mild hyperlipidemia    OP (osteoporosis)    SCCA (squamous cell carcinoma) of skin 03/30/2019   Past Surgical History:  Procedure Laterality Date   BREAST BIOPSY Right 2008   CORE - NEG   COLONOSCOPY  08/11/2014   COLONOSCOPY WITH PROPOFOL N/A 07/07/2020   Procedure: COLONOSCOPY WITH PROPOFOL;  Surgeon: Lin Landsman, MD;  Location: [facility];  Service: Gastroenterology;  Laterality: N/A;   LAPAROSCOPY     endometriosis   Family History  Problem Relation Age of Onset   CAD Father    Aortic aneurysm Father    Asthma Father    Heart failure Mother    Breast cancer Paternal Aunt    Social History   Socioeconomic History   Marital status: Married    Spouse name: Not on file   Number of children: 0   Years of education: Not on file   Highest education level:  Not on file  Occupational History   Occupation: retired  Tobacco Use   Smoking status: Never   Smokeless tobacco: Never  Vaping Use   Vaping Use: Never used  Substance and Sexual Activity   Alcohol use: Yes    Alcohol/week: 2.0 standard drinks    Types: 2 Standard drinks or equivalent per week    Comment: occasional   Drug use: No   Sexual activity: Yes    Birth control/protection: Post-menopausal, None  Other Topics Concern   Not on file  Social History Narrative   2nd marriage;  1st husband passed away 2019-01-12 parkinson's and dementia   Social Determinants of Health   Financial Resource Strain: Low Risk    Difficulty of Paying Living Expenses: Not hard at all  Food Insecurity: No Food Insecurity   Worried About Charity fundraiser in the Last Year: Never true   Arboriculturist in the Last Year: Never true  Transportation Needs: No Transportation Needs   Lack of Transportation (Medical): No   Lack of Transportation (Non-Medical): No  Physical Activity: Sufficiently Active   Days of Exercise per Week: 4 days   Minutes of Exercise per Session: 50 min  Stress: No Stress Concern Present   Feeling of Stress : Not at all  Social Connections: Moderately Isolated   Frequency of Communication with Friends and Family: More than three times a week   Frequency of Social Gatherings with Friends and Family: Three times a week   Attends Religious Services: More than 4 times per year   Active Member of Clubs or Organizations: No   Attends Archivist Meetings: Never   Marital Status: Widowed    Tobacco Counseling Counseling given: Not Answered   Clinical Intake:  Pre-visit preparation completed: Yes  Pain : No/denies pain     Nutritional Risks: Nausea/ vomitting/ diarrhea (occasional diarrhea) Diabetes: No  How often do you need to have someone help you when you read instructions, pamphlets, or other written materials from your doctor or pharmacy?: 1 - Never    Interpreter Needed?: No  Information entered by :: Janet Marker LPN   Activities of Daily Living In your present state of health, do you have any difficulty performing the following activities: 07/08/2021 04/10/2021  Hearing? N N  Vision? N N  Difficulty concentrating or making decisions? N N  Walking or climbing stairs? N N  Dressing or bathing? N N  Doing errands, shopping? N N  Preparing Food and eating ? N -  Using the Toilet? N -  In the past six months, have you accidently leaked  urine? N -  Do you have problems with loss of bowel control? N -  Managing your Medications? N -  Managing your Finances? N -  Housekeeping or managing your Housekeeping? N -  Some recent data might be hidden    Patient Care Team: Glean Hess, MD as PCP - General (Internal Medicine) Dermatology, [place] Vanga, Tally Due, MD as Consulting Physician (Gastroenterology)  Indicate any recent Medical Services you may have received from other than [facility] providers in the past year (date may be approximate).     Assessment:   This is a routine wellness examination for [place].  Hearing/Vision screen Hearing Screening - Comments:: Pt denies hearing difficulty Vision Screening - Comments:: Annual vision screenings [facility] Dr. Michelene Heady  Dietary issues and exercise activities discussed: Current Exercise Habits: Home exercise routine, Type of exercise: walking, Time (Minutes):  50, Frequency (Times/Week): 4, Weekly Exercise (Minutes/Week): 200, Intensity: Moderate, Exercise limited by: None identified   Goals Addressed             This Visit's Progress    Increase physical activity   On track    Pt states she would like to increase physical activity over the next year.        Depression Screen PHQ 2/9 Scores 07/08/2021 04/10/2021 04/10/2021 09/18/2020 06/11/2020 04/08/2020 11/20/2019  PHQ - 2 Score 0 0 0 0 0 0 0  PHQ- 9 Score - 1 1 0 - 0 0    Fall Risk Fall Risk  07/08/2021 04/10/2021 04/10/2021 09/18/2020 06/11/2020  Falls in the past year? 0 0 0 0 0  Number falls in past yr: 0 0 - - 0  Comment - - - - -  Injury with Fall? 0 0 - - 0  Risk for fall due to : No Fall Risks - - - No Fall Risks  Follow up Falls prevention discussed Falls evaluation completed Falls evaluation completed Falls evaluation completed Falls prevention discussed    FALL RISK PREVENTION PERTAINING TO THE HOME:  Any stairs in or around the home? Yes  If so, are there any without handrails? No   Home free of loose throw rugs in walkways, pet beds, electrical cords, etc? Yes  Adequate lighting in your home to reduce risk of falls? Yes   ASSISTIVE DEVICES UTILIZED TO PREVENT FALLS:  Life alert? No  Use of a cane, walker or w/c? No  Grab bars in the bathroom? Yes  Shower chair or bench in shower? Yes  Elevated toilet seat or a handicapped toilet? Yes   TIMED UP AND GO:  Was the test performed? No . Telephonic visit.   Cognitive Function: Normal cognitive status assessed by direct observation by this Nurse Health Advisor. No abnormalities found.          Immunizations Immunization History  Administered Date(s) Administered   Fluad Quad(high Dose 65+) 07/13/2020   Influenza, High Dose Seasonal PF 07/27/2019, 07/02/2021   Influenza,inj,Quad PF,6+ Mos 07/17/2017, 07/24/2018   Influenza-Unspecified 07/14/2015, 07/27/2019   PFIZER(Purple Top)SARS-COV-2 Vaccination 11/23/2019, 12/19/2019, 08/11/2020, 02/01/2021   Pfizer Covid-19 Vaccine Bivalent Booster 69yrs & up 07/02/2021   Pneumococcal Conjugate-13 04/08/2020   Pneumococcal Polysaccharide-23 04/10/2021   Tdap 08/25/2011   Zoster Recombinat (Shingrix) 07/24/2018, 12/01/2018   Zoster, Live 11/18/2014    TDAP status: Up to date  Flu Vaccine status: Up to date  Pneumococcal vaccine status: Up to date  Covid-19 vaccine status: Completed vaccines  Qualifies for Shingles Vaccine? Yes   Zostavax completed Yes   Shingrix Completed?: Yes  Screening Tests Health Maintenance  Topic Date Due   COVID-19 Vaccine (5 - Booster for [place] series) 06/03/2021   DEXA SCAN  06/05/2021   TETANUS/TDAP  08/24/2021   MAMMOGRAM  04/15/2022   COLONOSCOPY (Pts 45-13yrs Insurance coverage will need to be confirmed)  07/08/2027   INFLUENZA VACCINE  Completed   Zoster Vaccines- Shingrix  Completed   Hepatitis C Screening  Addressed   HPV VACCINES  Aged Out    Health Maintenance  Health Maintenance Due  Topic Date Due    COVID-19 Vaccine (5 - Booster for [place] series) 06/03/2021   DEXA SCAN  06/05/2021    Colorectal cancer screening: Type of screening: Colonoscopy. Completed 07/07/20. Repeat every 7 years  Mammogram status: Completed 04/15/21. Repeat every year  Bone Density status: Completed 06/06/19. Results reflect: Bone density results: OSTEOPENIA.  Repeat every 2 years. Pt requests to postpone until next year & completed with mammogram.  Lung Cancer Screening: (Low Dose CT Chest recommended if Age 30-80 years, 30 pack-year currently smoking OR have quit w/in 15years.) does not qualify.   Additional Screening:  Hepatitis C Screening: does qualify; Completed 12/14/10  Vision Screening: Recommended annual ophthalmology exams for early detection of glaucoma and other disorders of the eye. Is the patient up to date with their annual eye exam?  Yes  Who is the provider or what is the name of the office in which the patient attends annual eye exams? [facility].   Dental Screening: Recommended annual dental exams for proper oral hygiene  Community Resource Referral / Chronic Care Management: CRR required this visit?  No   CCM required this visit?  No      Plan:     I have personally reviewed and noted the following in the patient's chart:   Medical and social history Use of alcohol, tobacco or illicit drugs  Current medications and supplements including opioid prescriptions.  Functional ability and status Nutritional status Physical activity Advanced directives List of other physicians Hospitalizations, surgeries, and ER visits in previous 12 months Vitals Screenings to include cognitive, depression, and falls Referrals and appointments  In addition, I have reviewed and discussed with patient certain preventive protocols, quality metrics, and best practice recommendations. A written personalized care plan for preventive services as well as general preventive health recommendations were  provided to patient.     Janet Marker, LPN   10/29/1476   Nurse Notes: none

## 2021-07-08 NOTE — Patient Instructions (Signed)
Janet Cook , Thank you for taking time to come for your Medicare Wellness Visit. I appreciate your ongoing commitment to your health goals. Please review the following plan we discussed and let me know if I can assist you in the future.   Screening recommendations/referrals: Colonoscopy: done 07/07/20. Repeat 06/2027 Mammogram: done 04/15/21 Bone Density: done 06/06/19 Recommended yearly ophthalmology/optometry visit for glaucoma screening and checkup Recommended yearly dental visit for hygiene and checkup  Vaccinations: Influenza vaccine: done 07/02/21 Pneumococcal vaccine: done 04/10/21 Tdap vaccine: done 08/25/11 Shingles vaccine: done 07/24/18 & 12/01/18 Covid-19:done 11/23/19, 12/19/19, 08/11/20, 02/01/21 & 07/02/21  Advanced directives: Please bring a copy of your health care power of attorney and living will to the office at your convenience.   Conditions/risks identified: Keep up the great work!  Next appointment: Follow up in one year for your annual wellness visit    Preventive Care 65 Years and Older, Female Preventive care refers to lifestyle choices and visits with your health care provider that can promote health and wellness. What does preventive care include? A yearly physical exam. This is also called an annual well check. Dental exams once or twice a year. Routine eye exams. Ask your health care provider how often you should have your eyes checked. Personal lifestyle choices, including: Daily care of your teeth and gums. Regular physical activity. Eating a healthy diet. Avoiding tobacco and drug use. Limiting alcohol use. Practicing safe sex. Taking low-dose aspirin every day. Taking vitamin and mineral supplements as recommended by your health care provider. What happens during an annual well check? The services and screenings done by your health care provider during your annual well check will depend on your age, overall health, lifestyle risk factors, and family  history of disease. Counseling  Your health care provider may ask you questions about your: Alcohol use. Tobacco use. Drug use. Emotional well-being. Home and relationship well-being. Sexual activity. Eating habits. History of falls. Memory and ability to understand (cognition). Work and work Statistician. Reproductive health. Screening  You may have the following tests or measurements: Height, weight, and BMI. Blood pressure. Lipid and cholesterol levels. These may be checked every 5 years, or more frequently if you are over 34 years old. Skin check. Lung cancer screening. You may have this screening every year starting at age 89 if you have a 30-pack-year history of smoking and currently smoke or have quit within the past 15 years. Fecal occult blood test (FOBT) of the stool. You may have this test every year starting at age 61. Flexible sigmoidoscopy or colonoscopy. You may have a sigmoidoscopy every 5 years or a colonoscopy every 10 years starting at age 20. Hepatitis C blood test. Hepatitis B blood test. Sexually transmitted disease (STD) testing. Diabetes screening. This is done by checking your blood sugar (glucose) after you have not eaten for a while (fasting). You may have this done every 1-3 years. Bone density scan. This is done to screen for osteoporosis. You may have this done starting at age 51. Mammogram. This may be done every 1-2 years. Talk to your health care provider about how often you should have regular mammograms. Talk with your health care provider about your test results, treatment options, and if necessary, the need for more tests. Vaccines  Your health care provider may recommend certain vaccines, such as: Influenza vaccine. This is recommended every year. Tetanus, diphtheria, and acellular pertussis (Tdap, Td) vaccine. You may need a Td booster every 10 years. Zoster vaccine. You may need  this after age 4. Pneumococcal 13-valent conjugate (PCV13)  vaccine. One dose is recommended after age 1. Pneumococcal polysaccharide (PPSV23) vaccine. One dose is recommended after age 31. Talk to your health care provider about which screenings and vaccines you need and how often you need them. This information is not intended to replace advice given to you by your health care provider. Make sure you discuss any questions you have with your health care provider. Document Released: 10/24/2015 Document Revised: 06/16/2016 Document Reviewed: 07/29/2015 Elsevier Interactive Patient Education  2017 [place] Prevention in the Home Falls can cause injuries. They can happen to people of all ages. There are many things you can do to make your home safe and to help prevent falls. What can I do on the outside of my home? Regularly fix the edges of walkways and driveways and fix any cracks. Remove anything that might make you trip as you walk through a door, such as a raised step or threshold. Trim any bushes or trees on the path to your home. Use bright outdoor lighting. Clear any walking paths of anything that might make someone trip, such as rocks or tools. Regularly check to see if handrails are loose or broken. Make sure that both sides of any steps have handrails. Any raised decks and porches should have guardrails on the edges. Have any leaves, snow, or ice cleared regularly. Use sand or salt on walking paths during winter. Clean up any spills in your garage right away. This includes oil or grease spills. What can I do in the bathroom? Use night lights. Install grab bars by the toilet and in the tub and shower. Do not use towel bars as grab bars. Use non-skid mats or decals in the tub or shower. If you need to sit down in the shower, use a plastic, non-slip stool. Keep the floor dry. Clean up any water that spills on the floor as soon as it happens. Remove soap buildup in the tub or shower regularly. Attach bath mats securely with  double-sided non-slip rug tape. Do not have throw rugs and other things on the floor that can make you trip. What can I do in the bedroom? Use night lights. Make sure that you have a light by your bed that is easy to reach. Do not use any sheets or blankets that are too big for your bed. They should not hang down onto the floor. Have a firm chair that has side arms. You can use this for support while you get dressed. Do not have throw rugs and other things on the floor that can make you trip. What can I do in the kitchen? Clean up any spills right away. Avoid walking on wet floors. Keep items that you use a lot in easy-to-reach places. If you need to reach something above you, use a strong step stool that has a grab bar. Keep electrical cords out of the way. Do not use floor polish or wax that makes floors slippery. If you must use wax, use non-skid floor wax. Do not have throw rugs and other things on the floor that can make you trip. What can I do with my stairs? Do not leave any items on the stairs. Make sure that there are handrails on both sides of the stairs and use them. Fix handrails that are broken or loose. Make sure that handrails are as long as the stairways. Check any carpeting to make sure that it is firmly attached to  the stairs. Fix any carpet that is loose or worn. Avoid having throw rugs at the top or bottom of the stairs. If you do have throw rugs, attach them to the floor with carpet tape. Make sure that you have a light switch at the top of the stairs and the bottom of the stairs. If you do not have them, ask someone to add them for you. What else can I do to help prevent falls? Wear shoes that: Do not have high heels. Have rubber bottoms. Are comfortable and fit you well. Are closed at the toe. Do not wear sandals. If you use a stepladder: Make sure that it is fully opened. Do not climb a closed stepladder. Make sure that both sides of the stepladder are locked  into place. Ask someone to hold it for you, if possible. Clearly mark and make sure that you can see: Any grab bars or handrails. First and last steps. Where the edge of each step is. Use tools that help you move around (mobility aids) if they are needed. These include: Canes. Walkers. Scooters. Crutches. Turn on the lights when you go into a dark area. Replace any light bulbs as soon as they burn out. Set up your furniture so you have a clear path. Avoid moving your furniture around. If any of your floors are uneven, fix them. If there are any pets around you, be aware of where they are. Review your medicines with your doctor. Some medicines can make you feel dizzy. This can increase your chance of falling. Ask your doctor what other things that you can do to help prevent falls. This information is not intended to replace advice given to you by your health care provider. Make sure you discuss any questions you have with your health care provider. Document Released: 07/24/2009 Document Revised: 03/04/2016 Document Reviewed: 11/01/2014 Elsevier Interactive Patient Education  2017 [employer].

## 2021-08-25 DIAGNOSIS — L821 Other seborrheic keratosis: Secondary | ICD-10-CM | POA: Diagnosis not present

## 2021-08-25 DIAGNOSIS — L298 Other pruritus: Secondary | ICD-10-CM | POA: Diagnosis not present

## 2021-08-25 DIAGNOSIS — D2261 Melanocytic nevi of right upper limb, including shoulder: Secondary | ICD-10-CM | POA: Diagnosis not present

## 2021-08-25 DIAGNOSIS — C44319 Basal cell carcinoma of skin of other parts of face: Secondary | ICD-10-CM | POA: Diagnosis not present

## 2021-08-25 DIAGNOSIS — D225 Melanocytic nevi of trunk: Secondary | ICD-10-CM | POA: Diagnosis not present

## 2021-08-25 DIAGNOSIS — D2262 Melanocytic nevi of left upper limb, including shoulder: Secondary | ICD-10-CM | POA: Diagnosis not present

## 2021-08-25 DIAGNOSIS — D485 Neoplasm of uncertain behavior of skin: Secondary | ICD-10-CM | POA: Diagnosis not present

## 2021-08-25 DIAGNOSIS — L820 Inflamed seborrheic keratosis: Secondary | ICD-10-CM | POA: Diagnosis not present

## 2021-08-25 DIAGNOSIS — Z85828 Personal history of other malignant neoplasm of skin: Secondary | ICD-10-CM | POA: Diagnosis not present

## 2021-09-24 DIAGNOSIS — F418 Other specified anxiety disorders: Secondary | ICD-10-CM | POA: Diagnosis not present

## 2021-10-29 DIAGNOSIS — C44319 Basal cell carcinoma of skin of other parts of face: Secondary | ICD-10-CM | POA: Diagnosis not present

## 2021-11-05 DIAGNOSIS — F418 Other specified anxiety disorders: Secondary | ICD-10-CM | POA: Diagnosis not present

## 2021-11-16 ENCOUNTER — Other Ambulatory Visit: Payer: Self-pay | Admitting: Internal Medicine

## 2021-11-16 NOTE — Telephone Encounter (Signed)
Requested Prescriptions  Pending Prescriptions Disp Refills   traZODone (DESYREL) 50 MG tablet [Pharmacy Med Name: TRAZODONE 50 MG TABLET] 90 tablet 1    Sig: TAKE 1 TABLET BY MOUTH EVERYDAY AT BEDTIME     Psychiatry: Antidepressants - Serotonin Modulator Failed - 11/16/2021 11:27 AM      Failed - Valid encounter within last 6 months    Recent Outpatient Visits          7 months ago Annual physical exam   [facility] Glean Hess, MD   1 year ago Chronic recurrent major depressive disorder [facility])   [facility] Glean Hess, MD   1 year ago Annual physical exam   [facility] Glean Hess, MD   1 year ago BV (bacterial vaginosis)   [facility] Glean Hess, MD   2 years ago Annual physical exam   [facility] Glean Hess, MD      Future Appointments            In 5 months Army Melia Jesse Sans, MD [place] - Completed PHQ-2 or PHQ-9 in the last 360 days

## 2021-11-24 ENCOUNTER — Other Ambulatory Visit: Payer: Self-pay | Admitting: Internal Medicine

## 2021-11-25 NOTE — Telephone Encounter (Signed)
Requested Prescriptions  Pending Prescriptions Disp Refills   valACYclovir (VALTREX) 1000 MG tablet [Pharmacy Med Name: VALACYCLOVIR HCL 1 GRAM TABLET] 60 tablet 0    Sig: TAKE 2 TABLETS (2,000 MG TOTAL) BY MOUTH 2 (TWO) TIMES DAILY.     Antimicrobials:  Antiviral Agents - Anti-Herpetic Passed - 11/24/2021  4:53 PM      Passed - Valid encounter within last 12 months    Recent Outpatient Visits          7 months ago Annual physical exam   [facility] Glean Hess, MD   1 year ago Chronic recurrent major depressive disorder [facility])   [facility] Glean Hess, MD   1 year ago Annual physical exam   [facility] Glean Hess, MD   2 years ago BV (bacterial vaginosis)   [facility] Glean Hess, MD   2 years ago Annual physical exam   [facility] Glean Hess, MD      Future Appointments            In 5 months Army Melia Jesse Sans, MD [facility], [facility]

## 2021-11-27 DIAGNOSIS — F418 Other specified anxiety disorders: Secondary | ICD-10-CM | POA: Diagnosis not present

## 2021-12-03 ENCOUNTER — Other Ambulatory Visit: Payer: Self-pay | Admitting: Internal Medicine

## 2021-12-03 NOTE — Telephone Encounter (Signed)
Requested Prescriptions  Pending Prescriptions Disp Refills   valACYclovir (VALTREX) 1000 MG tablet [Pharmacy Med Name: VALACYCLOVIR HCL 1 GRAM TABLET] 60 tablet 0    Sig: TAKE 2 TABLETS (2,000 MG TOTAL) BY MOUTH TWICE A DAY     Antimicrobials:  Antiviral Agents - Anti-Herpetic Passed - 12/03/2021  8:33 AM      Passed - Valid encounter within last 12 months    Recent Outpatient Visits          7 months ago Annual physical exam   [facility] Glean Hess, MD   1 year ago Chronic recurrent major depressive disorder [facility])   [facility] Glean Hess, MD   1 year ago Annual physical exam   [facility] Glean Hess, MD   2 years ago BV (bacterial vaginosis)   [facility] Glean Hess, MD   2 years ago Annual physical exam   [facility] Glean Hess, MD      Future Appointments            In 5 months Army Melia Jesse Sans, MD [facility], [facility]

## 2021-12-17 DIAGNOSIS — F418 Other specified anxiety disorders: Secondary | ICD-10-CM | POA: Diagnosis not present

## 2021-12-24 ENCOUNTER — Other Ambulatory Visit: Payer: Self-pay | Admitting: Internal Medicine

## 2021-12-24 NOTE — Telephone Encounter (Signed)
Requested Prescriptions  ?Pending Prescriptions Disp Refills  ?? valACYclovir (VALTREX) 1000 MG tablet [Pharmacy Med Name: VALACYCLOVIR HCL 1 GRAM TABLET] 60 tablet 0  ?  Sig: TAKE 2 TABLETS (2,000 MG TOTAL) BY MOUTH TWICE A DAY  ?  ? Antimicrobials:  Antiviral Agents - Anti-Herpetic Passed - 12/24/2021  8:34 AM  ?  ?  Passed - Valid encounter within last 12 months  ?  Recent Outpatient Visits   ?      ? 8 months ago Annual physical exam  ? [facility] Glean Hess, MD  ? 1 year ago Chronic recurrent major depressive disorder [facility])  ? [facility] Glean Hess, MD  ? 1 year ago Annual physical exam  ? [facility] Glean Hess, MD  ? 2 years ago BV (bacterial vaginosis)  ? [facility] Glean Hess, MD  ? 2 years ago Annual physical exam  ? [facility] Glean Hess, MD  ?  ?  ?Future Appointments   ?        ? In 4 months Army Melia Jesse Sans, MD [facility] [place]  ?  ? ?  ?  ?  ? ?

## 2022-01-06 ENCOUNTER — Other Ambulatory Visit: Payer: Self-pay | Admitting: Internal Medicine

## 2022-01-07 DIAGNOSIS — F418 Other specified anxiety disorders: Secondary | ICD-10-CM | POA: Diagnosis not present

## 2022-01-07 NOTE — Telephone Encounter (Signed)
Requested Prescriptions  ?Pending Prescriptions Disp Refills  ?? valACYclovir (VALTREX) 1000 MG tablet [Pharmacy Med Name: VALACYCLOVIR HCL 1 GRAM TABLET] 120 tablet 2  ?  Sig: TAKE 2 TABLETS (2,000 MG TOTAL) BY MOUTH TWICE A DAY  ?  ? Antimicrobials:  Antiviral Agents - Anti-Herpetic Passed - 01/06/2022  1:34 PM  ?  ?  Passed - Valid encounter within last 12 months  ?  Recent Outpatient Visits   ?      ? 9 months ago Annual physical exam  ? [facility] Glean Hess, MD  ? 1 year ago Chronic recurrent major depressive disorder [facility])  ? [facility] Glean Hess, MD  ? 1 year ago Annual physical exam  ? [facility] Glean Hess, MD  ? 2 years ago BV (bacterial vaginosis)  ? [facility] Glean Hess, MD  ? 2 years ago Annual physical exam  ? [facility] Glean Hess, MD  ?  ?  ?Future Appointments   ?        ? In 4 months Army Melia Jesse Sans, MD [facility] [place]  ?  ? ?  ?  ?  ? ?

## 2022-01-25 DIAGNOSIS — D1722 Benign lipomatous neoplasm of skin and subcutaneous tissue of left arm: Secondary | ICD-10-CM | POA: Diagnosis not present

## 2022-01-25 DIAGNOSIS — L538 Other specified erythematous conditions: Secondary | ICD-10-CM | POA: Diagnosis not present

## 2022-01-25 DIAGNOSIS — Z85828 Personal history of other malignant neoplasm of skin: Secondary | ICD-10-CM | POA: Diagnosis not present

## 2022-01-25 DIAGNOSIS — D225 Melanocytic nevi of trunk: Secondary | ICD-10-CM | POA: Diagnosis not present

## 2022-01-25 DIAGNOSIS — D2272 Melanocytic nevi of left lower limb, including hip: Secondary | ICD-10-CM | POA: Diagnosis not present

## 2022-01-25 DIAGNOSIS — D485 Neoplasm of uncertain behavior of skin: Secondary | ICD-10-CM | POA: Diagnosis not present

## 2022-01-25 DIAGNOSIS — B078 Other viral warts: Secondary | ICD-10-CM | POA: Diagnosis not present

## 2022-01-25 DIAGNOSIS — D2261 Melanocytic nevi of right upper limb, including shoulder: Secondary | ICD-10-CM | POA: Diagnosis not present

## 2022-01-25 DIAGNOSIS — D2262 Melanocytic nevi of left upper limb, including shoulder: Secondary | ICD-10-CM | POA: Diagnosis not present

## 2022-01-28 DIAGNOSIS — F418 Other specified anxiety disorders: Secondary | ICD-10-CM | POA: Diagnosis not present

## 2022-03-13 ENCOUNTER — Other Ambulatory Visit: Payer: Self-pay | Admitting: Internal Medicine

## 2022-03-15 NOTE — Telephone Encounter (Signed)
Requested Prescriptions  Pending Prescriptions Disp Refills  . FLUoxetine (PROZAC) 40 MG capsule [Pharmacy Med Name: FLUOXETINE HCL 40 MG CAPSULE] 90 capsule 0    Sig: TAKE 1 CAPSULE BY MOUTH EVERY DAY     Psychiatry:  Antidepressants - SSRI Failed - 03/13/2022 11:22 AM      Failed - Valid encounter within last 6 months    Recent Outpatient Visits          11 months ago Annual physical exam   [facility] Glean Hess, MD   1 year ago Chronic recurrent major depressive disorder [facility])   [facility] Glean Hess, MD   1 year ago Annual physical exam   [facility] Glean Hess, MD   2 years ago BV (bacterial vaginosis)   [facility] Glean Hess, MD   2 years ago Annual physical exam   [facility] Glean Hess, MD      Future Appointments            In 1 month Army Melia Jesse Sans, MD [place] - Completed PHQ-2 or PHQ-9 in the last 360 days

## 2022-04-19 ENCOUNTER — Encounter: Payer: Medicare PPO | Admitting: Internal Medicine

## 2022-04-21 ENCOUNTER — Other Ambulatory Visit: Payer: Self-pay | Admitting: Internal Medicine

## 2022-04-21 DIAGNOSIS — Z1231 Encounter for screening mammogram for malignant neoplasm of breast: Secondary | ICD-10-CM

## 2022-04-23 IMAGING — MG DIGITAL SCREENING BILAT W/ TOMO W/ CAD
8 series · 8 of 24 positions shown · non-contrast
Comparison: Previous exam(s).

CLINICAL DATA: Screening.

EXAM:
DIGITAL SCREENING BILATERAL MAMMOGRAM WITH TOMO AND CAD

[R CC synth-2D]
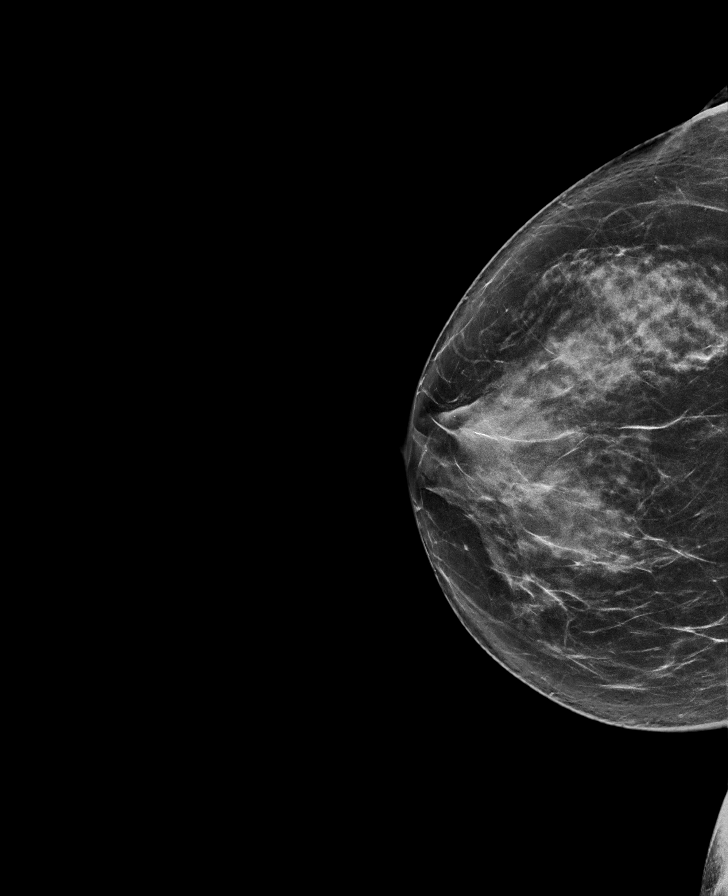

[L CC synth-2D]
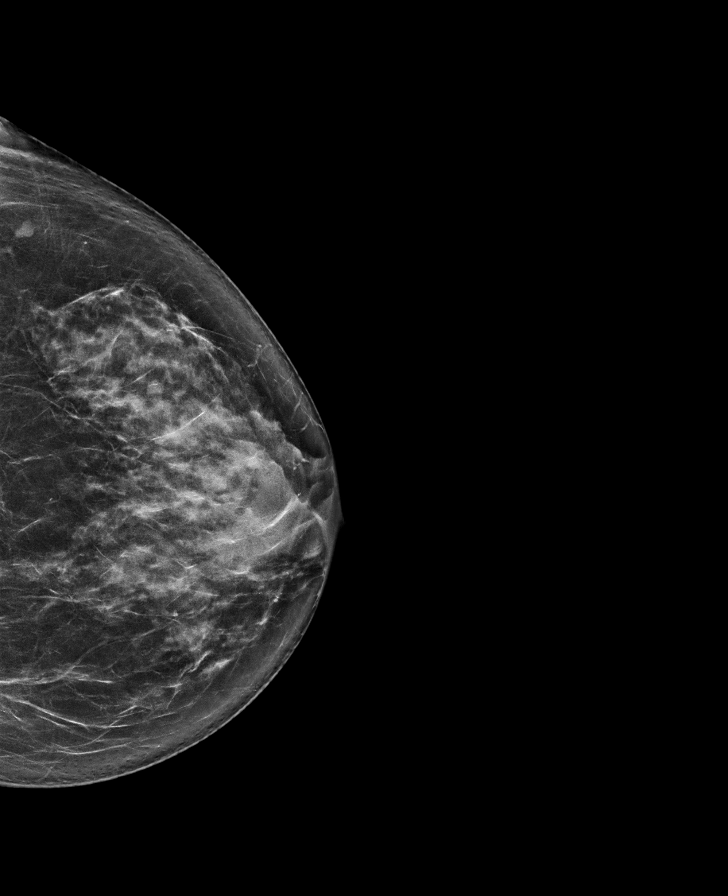

[R MLO synth-2D]
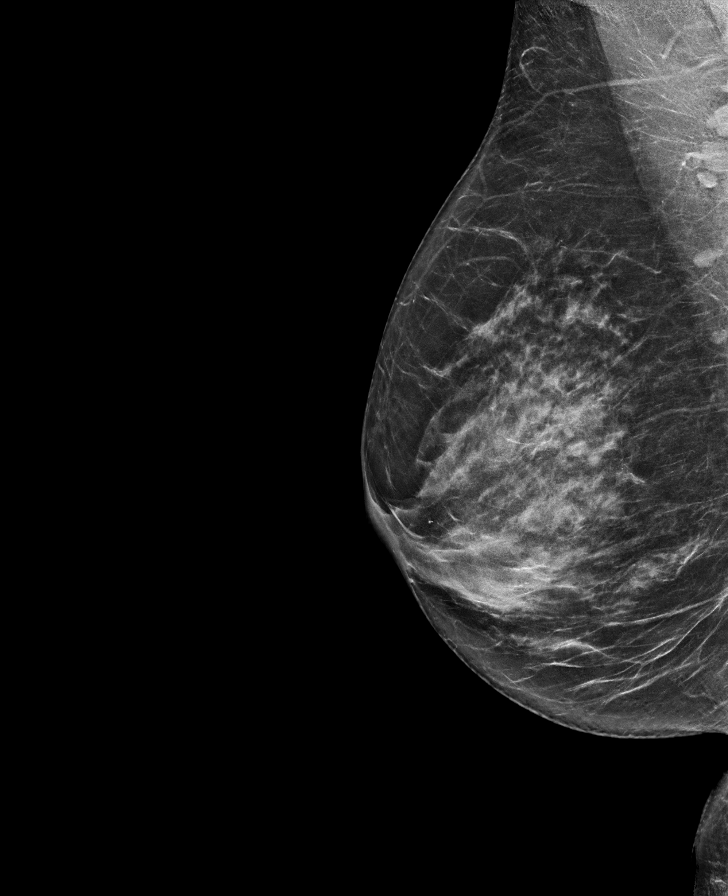

[L MLO synth-2D]
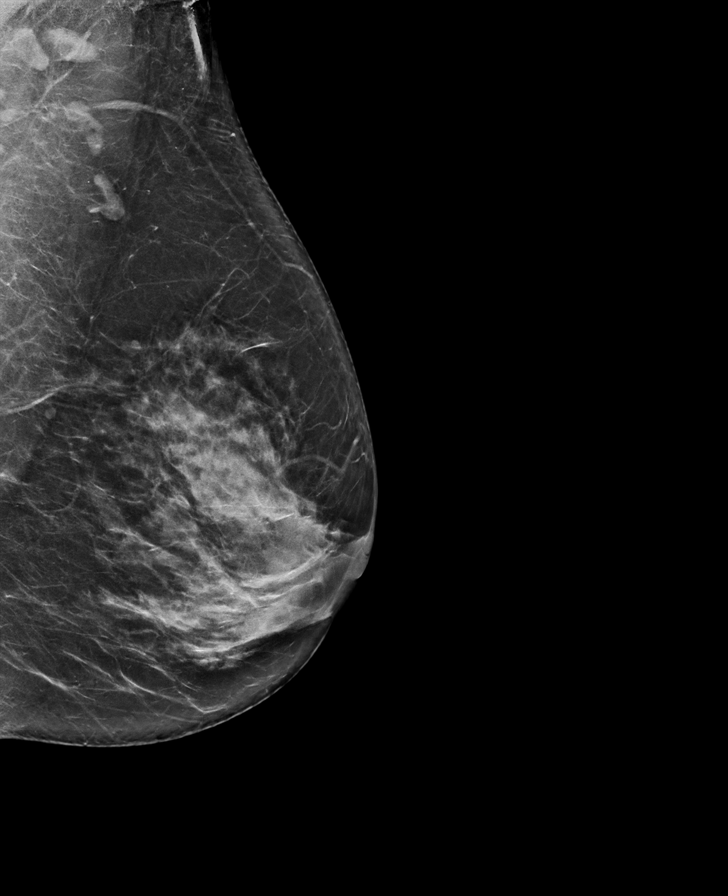

[L CC tomo · tomo slice 39/76.0]
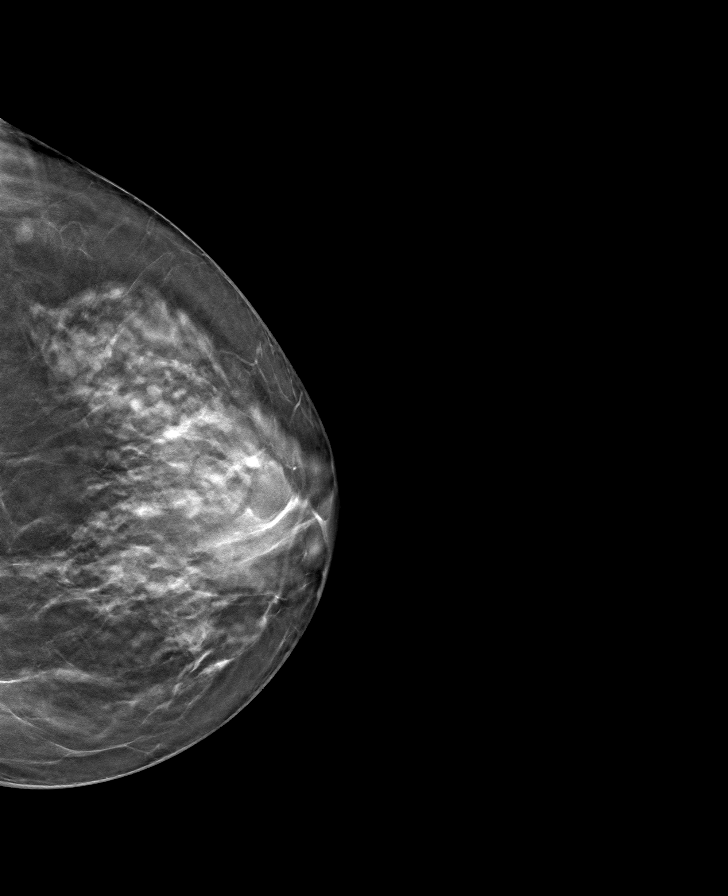

[L MLO tomo · tomo slice 39/77.0]
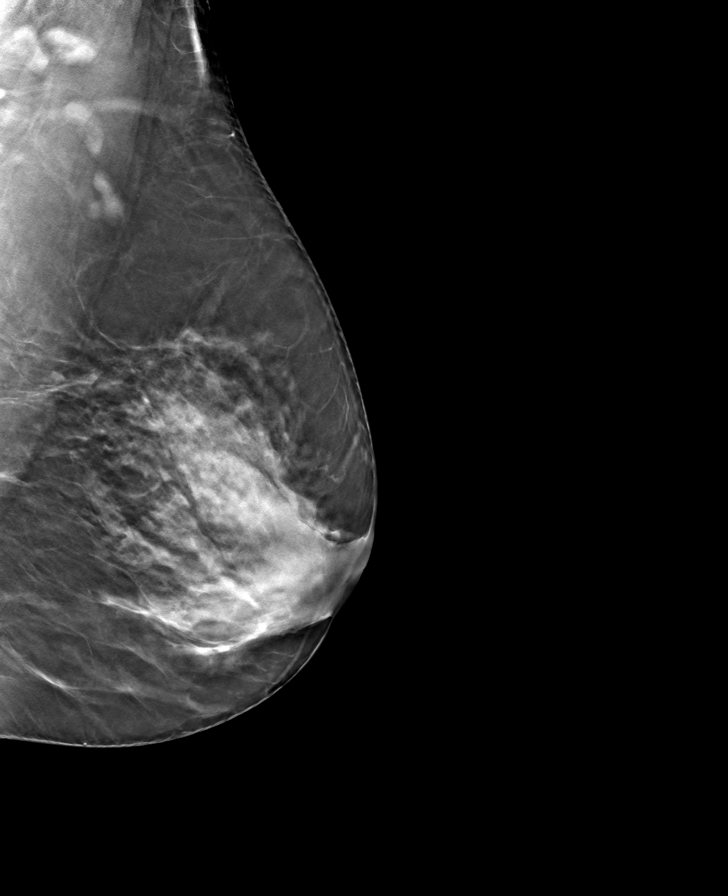

[R CC tomo · tomo slice 39/78.0]
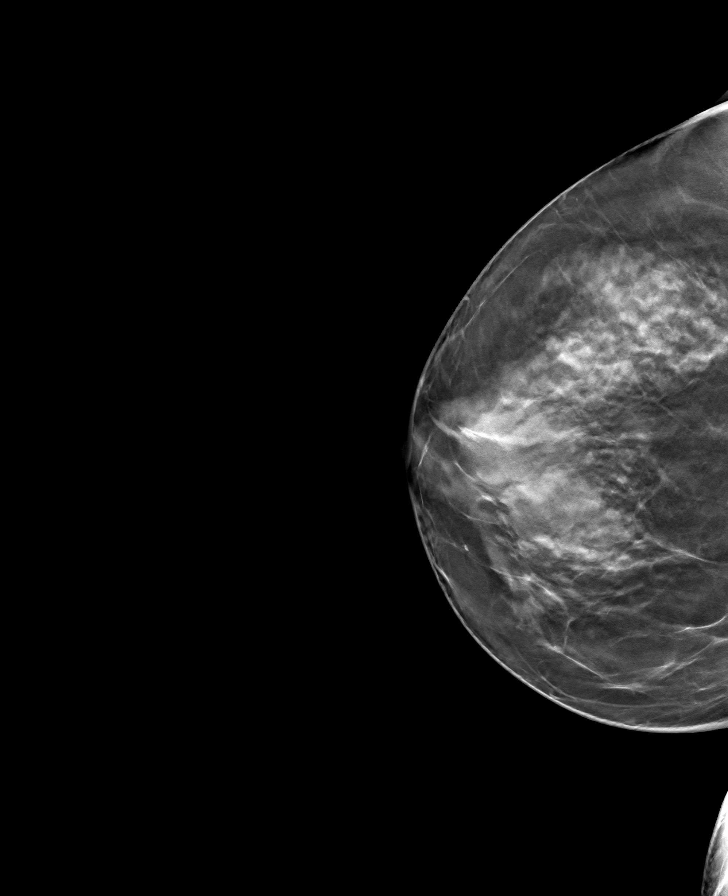

[R MLO tomo · tomo slice 36/71.0]
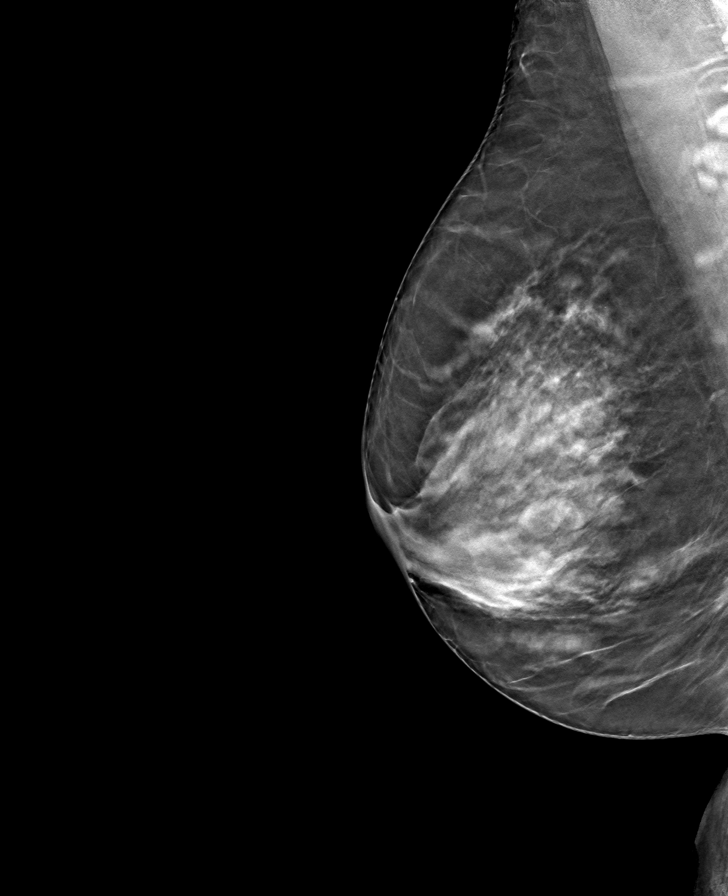

[8 of 24 positions shown; findings below may reference images not displayed]

ACR Breast Density Category c: The breast tissue is heterogeneously
dense, which may obscure small masses.
FINDINGS: There are no findings suspicious for malignancy. Images were
processed with CAD.
IMPRESSION: No mammographic evidence of malignancy. A result letter of this
screening mammogram will be mailed directly to the patient.

RECOMMENDATION:
Screening mammogram in one year. (Code:[id number])

BI-RADS CATEGORY  1: Negative.

## 2022-04-29 ENCOUNTER — Encounter: Payer: Medicare PPO | Admitting: Internal Medicine

## 2022-05-03 DIAGNOSIS — F418 Other specified anxiety disorders: Secondary | ICD-10-CM | POA: Diagnosis not present

## 2022-05-04 ENCOUNTER — Other Ambulatory Visit: Payer: Self-pay | Admitting: Internal Medicine

## 2022-05-05 DIAGNOSIS — H40003 Preglaucoma, unspecified, bilateral: Secondary | ICD-10-CM | POA: Diagnosis not present

## 2022-05-05 DIAGNOSIS — Z0100 Encounter for examination of eyes and vision without abnormal findings: Secondary | ICD-10-CM | POA: Diagnosis not present

## 2022-05-05 DIAGNOSIS — H2513 Age-related nuclear cataract, bilateral: Secondary | ICD-10-CM | POA: Diagnosis not present

## 2022-05-05 NOTE — Telephone Encounter (Signed)
Pt was instructed by PCP to return in 1 yr, pt has OV scheduled for CPE on 05/07/22, will refill medication.  Requested Prescriptions  Pending Prescriptions Disp Refills  . traZODone (DESYREL) 50 MG tablet [Pharmacy Med Name: TRAZODONE 50 MG TABLET] 90 tablet 1    Sig: TAKE 1 TABLET BY MOUTH EVERYDAY AT BEDTIME     Psychiatry: Antidepressants - Serotonin Modulator Failed - 05/04/2022  9:52 AM      Failed - Valid encounter within last 6 months    Recent Outpatient Visits          1 year ago Annual physical exam   [facility] Glean Hess, MD   1 year ago Chronic recurrent major depressive disorder [facility])   [facility] Glean Hess, MD   2 years ago Annual physical exam   [facility] Glean Hess, MD   2 years ago BV (bacterial vaginosis)   [facility] Glean Hess, MD   3 years ago Annual physical exam   [facility] Glean Hess, MD      Future Appointments            In 2 days Glean Hess, MD [place] - Completed PHQ-2 or PHQ-9 in the last 360 days

## 2022-05-07 ENCOUNTER — Ambulatory Visit (INDEPENDENT_AMBULATORY_CARE_PROVIDER_SITE_OTHER): Payer: Medicare PPO | Admitting: Internal Medicine

## 2022-05-07 ENCOUNTER — Encounter: Payer: Self-pay | Admitting: Internal Medicine

## 2022-05-07 VITALS — BP 118/72 | HR 71 | Ht 64.0 in | Wt 136.0 lb

## 2022-05-07 DIAGNOSIS — E559 Vitamin D deficiency, unspecified: Secondary | ICD-10-CM

## 2022-05-07 DIAGNOSIS — F324 Major depressive disorder, single episode, in partial remission: Secondary | ICD-10-CM | POA: Diagnosis not present

## 2022-05-07 DIAGNOSIS — E785 Hyperlipidemia, unspecified: Secondary | ICD-10-CM

## 2022-05-07 DIAGNOSIS — Z1231 Encounter for screening mammogram for malignant neoplasm of breast: Secondary | ICD-10-CM

## 2022-05-07 DIAGNOSIS — Z0000 Encounter for general adult medical examination without abnormal findings: Secondary | ICD-10-CM | POA: Diagnosis not present

## 2022-05-07 DIAGNOSIS — M810 Age-related osteoporosis without current pathological fracture: Secondary | ICD-10-CM

## 2022-05-07 MED ORDER — FLUOXETINE HCL 40 MG PO CAPS: ORAL_CAPSULE | ORAL | 3 refills | Status: DC

## 2022-05-07 NOTE — Progress Notes (Signed)
  Date:  05/07/2022   Name:  Janet Cook   DOB:  04/26/1956   MRN:  [id number]   Chief Complaint: Annual Exam (Breast Exam.) Janet Cook is a 66 y.o. female who presents today for her Complete Annual Exam. She feels well. She reports exercising. She reports she is sleeping well. Breast complaints - none.  Mammogram: 04/2021 scheduled DEXA: 05/2019 osteopenia after initial scan showed osteoporosis of femur in 2015 Pap smear: discontinued Colonoscopy: 06/2020 repeat 7 yrs  Health Maintenance Due  Topic Date Due   DEXA SCAN  06/05/2021   TETANUS/TDAP  08/24/2021   MAMMOGRAM  04/15/2022    Immunization History  Administered Date(s) Administered   Fluad Quad(high Dose 65+) 07/13/2020   Influenza, High Dose Seasonal PF 07/27/2019, 07/02/2021   Influenza,inj,Quad PF,6+ Mos 07/17/2017, 07/24/2018   Influenza-Unspecified 07/14/2015, 07/27/2019   PFIZER(Purple Top)SARS-COV-2 Vaccination 11/23/2019, 12/19/2019, 08/11/2020, 02/01/2021   Pfizer Covid-19 Vaccine Bivalent Booster 12yrs & up 07/02/2021   Pneumococcal Conjugate-13 04/08/2020   Pneumococcal Polysaccharide-23 04/10/2021   Tdap 08/25/2011   Zoster Recombinat (Shingrix) 07/24/2018, 12/01/2018   Zoster, Live 11/18/2014    Depression        This is a chronic problem.The problem is unchanged.  Associated symptoms include no fatigue and no headaches.  Past treatments include SSRIs - Selective serotonin reuptake inhibitors. OP - last dexa 2020 was improved.  On Calcium and Vitamin D.  Lab Results  Component Value Date   NA 139 04/10/2021   K 4.4 04/10/2021   CO2 21 04/10/2021   GLUCOSE 90 04/10/2021   BUN 14 04/10/2021   CREATININE 0.86 04/10/2021   CALCIUM 9.6 04/10/2021   EGFR 74 04/10/2021   GFRNONAA 73 04/08/2020   Lab Results  Component Value Date   CHOL 196 04/10/2021   HDL 64 04/10/2021   LDLCALC 114 (H) 04/10/2021   TRIG 103 04/10/2021   CHOLHDL 3.1 04/10/2021   Lab Results  Component Value Date   TSH  0.901 04/10/2021   No results found for: "HGBA1C" Lab Results  Component Value Date   WBC 7.2 04/10/2021   HGB 14.8 04/10/2021   HCT 44.5 04/10/2021   MCV 90 04/10/2021   PLT 302 04/10/2021   Lab Results  Component Value Date   ALT 17 04/10/2021   AST 22 04/10/2021   ALKPHOS 62 04/10/2021   BILITOT 0.4 04/10/2021   Lab Results  Component Value Date   VD25OH 44.2 04/10/2021     Review of Systems  Constitutional:  Negative for chills, fatigue and fever.  HENT:  Negative for congestion, hearing loss, tinnitus, trouble swallowing and voice change.   Eyes:  Negative for visual disturbance.  Respiratory:  Negative for cough, chest tightness, shortness of breath and wheezing.   Cardiovascular:  Negative for chest pain, palpitations and leg swelling.  Gastrointestinal:  Negative for abdominal pain, constipation, diarrhea and vomiting.  Endocrine: Negative for polydipsia and polyuria.  Genitourinary:  Negative for dysuria, frequency, genital sores, vaginal bleeding and vaginal discharge.  Musculoskeletal:  Negative for arthralgias, gait problem and joint swelling.  Skin:  Negative for color change and rash.  Neurological:  Negative for dizziness, tremors, light-headedness and headaches.  Hematological:  Negative for adenopathy. Does not bruise/bleed easily.  Psychiatric/Behavioral:  Positive for depression. Negative for dysphoric mood and sleep disturbance. The patient is not nervous/anxious.     Patient Active Problem List   Diagnosis Date Noted   Hx of adenomatous colonic polyps    Mild   hyperlipidemia 03/31/2019   SCCA (squamous cell carcinoma) of skin 03/30/2019   Asthma, mild intermittent 08/11/2015   Allergic state 08/11/2015   Genital herpes simplex type 2 08/11/2015   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) 08/11/2015   OP (osteoporosis) 08/11/2015   Idiopathic insomnia 08/11/2015   Avitaminosis D 08/11/2015    No Known Allergies  Past Surgical  History:  Procedure Laterality Date   BREAST BIOPSY Right 2008   CORE - NEG   COLONOSCOPY  08/11/2014   COLONOSCOPY WITH PROPOFOL N/A 07/07/2020   Procedure: COLONOSCOPY WITH PROPOFOL;  Surgeon: Vanga, Rohini Reddy, MD;  Location: [facility] ENDOSCOPY;  Service: Gastroenterology;  Laterality: N/A;   LAPAROSCOPY     endometriosis    Social History   Tobacco Use   Smoking status: Never   Smokeless tobacco: Never  Vaping Use   Vaping Use: Never used  Substance Use Topics   Alcohol use: Yes    Alcohol/week: 2.0 standard drinks of alcohol    Types: 2 Standard drinks or equivalent per week    Comment: occasional   Drug use: No     Medication list has been reviewed and updated.  Current Meds  Medication Sig   albuterol (VENTOLIN HFA) 108 (90 Base) MCG/ACT inhaler INHALE TWO PUFFS FOUR TIMES DAILY AS NEEDED   Calcium Carbonate-Vit D-Min (CALTRATE 600+D PLUS PO) Take by mouth.   fexofenadine (ALLEGRA) 60 MG tablet Take 60 mg by mouth 2 (two) times daily.   FLUoxetine (PROZAC) 40 MG capsule TAKE 1 CAPSULE BY MOUTH EVERY DAY   traZODone (DESYREL) 50 MG tablet TAKE 1 TABLET BY MOUTH EVERYDAY AT BEDTIME   valACYclovir (VALTREX) 1000 MG tablet TAKE 2 TABLETS (2,000 MG TOTAL) BY MOUTH TWICE A DAY       05/07/2022    9:59 AM 04/10/2021    9:26 AM 04/10/2021    9:23 AM 09/18/2020    9:07 AM  GAD 7 : Generalized Anxiety Score  Nervous, Anxious, on Edge 0 0 0 0  Control/stop worrying 0 0 0 0  Worry too much - different things 0 0 0 0  Trouble relaxing 0 0 0 0  Restless 0 0 0 0  Easily annoyed or irritable 0 0 0 0  Afraid - awful might happen 0 0 0 0  Total GAD 7 Score 0 0 0 0  Anxiety Difficulty Not difficult at all Not difficult at all         05/07/2022    9:59 AM 07/08/2021    8:06 AM 04/10/2021    9:26 AM  Depression screen PHQ 2/9  Decreased Interest 0 0 0  Down, Depressed, Hopeless 0 0 0  PHQ - 2 Score 0 0 0  Altered sleeping 0  0  Tired, decreased energy 0  1  Change in  appetite 0  0  Feeling bad or failure about yourself  0  0  Trouble concentrating 0  0  Moving slowly or fidgety/restless 0  0  Suicidal thoughts 0  0  PHQ-9 Score 0  1  Difficult doing work/chores Not difficult at all  Not difficult at all    BP Readings from Last 3 Encounters:  05/07/22 118/72  04/10/21 110/72  09/18/20 128/80    Physical Exam Vitals and nursing note reviewed.  Constitutional:      General: She is not in acute distress.    Appearance: She is well-developed.  HENT:     Head: Normocephalic and atraumatic.       Right Ear: Tympanic membrane and ear canal normal.     Left Ear: Tympanic membrane and ear canal normal.     Nose:     Right Sinus: No maxillary sinus tenderness.     Left Sinus: No maxillary sinus tenderness.  Eyes:     General: No scleral icterus.       Right eye: No discharge.        Left eye: No discharge.     Conjunctiva/sclera: Conjunctivae normal.  Neck:     Thyroid: No thyromegaly.     Vascular: No carotid bruit.  Cardiovascular:     Rate and Rhythm: Normal rate and regular rhythm.     Pulses: Normal pulses.     Heart sounds: Normal heart sounds.  Pulmonary:     Effort: Pulmonary effort is normal. No respiratory distress.     Breath sounds: No wheezing.  Chest:  Breasts:    Right: No mass, nipple discharge, skin change or tenderness.     Left: No mass, nipple discharge, skin change or tenderness.  Abdominal:     General: Bowel sounds are normal.     Palpations: Abdomen is soft.     Tenderness: There is no abdominal tenderness.  Musculoskeletal:     Cervical back: Normal range of motion. No erythema.     Right lower leg: No edema.     Left lower leg: No edema.  Lymphadenopathy:     Cervical: No cervical adenopathy.  Skin:    General: Skin is warm and dry.     Findings: No rash.  Neurological:     Mental Status: She is alert and oriented to person, place, and time.     Cranial Nerves: No cranial nerve deficit.     Sensory: No  sensory deficit.     Deep Tendon Reflexes: Reflexes are normal and symmetric.  Psychiatric:        Attention and Perception: Attention normal.        Mood and Affect: Mood normal.     Wt Readings from Last 3 Encounters:  05/07/22 136 lb (61.7 kg)  04/10/21 147 lb (66.7 kg)  09/18/20 153 lb (69.4 kg)    BP 118/72   Pulse 71   Ht 5' 4" (1.626 m)   Wt 136 lb (61.7 kg)   SpO2 96%   BMI 23.34 kg/m   Assessment and Plan: 1. Annual physical exam Normal exam. Continue healthy diet and exercise. Up to date on screenings and immunizations. Recommend Tdap booster if she develops any wounds - CBC with Differential/Platelet - Comprehensive metabolic panel  2. Encounter for screening mammogram for breast cancer scheduled  3. Age-related osteoporosis without current pathological fracture Recommend repeat - pt to schedule - DG Bone Density  4. Major depressive disorder with single episode, in partial remission (HCC) Clinically stable on current regimen with good control of symptoms, No SI or HI. Will continue current therapy. - TSH - FLUoxetine (PROZAC) 40 MG capsule; TAKE 1 CAPSULE BY MOUTH EVERY DAY  Dispense: 90 capsule; Refill: 3  5. Mild hyperlipidemia Check labs and advise - Lipid panel  6. Avitaminosis D Last lab level was normal.   Partially dictated using Dragon software. Any errors are unintentional.  Laura Berglund, MD [facility]  05/07/2022      

## 2022-05-08 LAB — CBC WITH DIFFERENTIAL/PLATELET
Basophils Absolute: 0.1 10*3/uL (ref 0.0–0.2)
Basos: 1 %
EOS (ABSOLUTE): 0.2 10*3/uL (ref 0.0–0.4)
Eos: 3 %
Hematocrit: 45.5 % (ref 34.0–46.6)
Hemoglobin: 14.7 g/dL (ref 11.1–15.9)
Immature Grans (Abs): 0 10*3/uL (ref 0.0–0.1)
Immature Granulocytes: 0 %
Lymphocytes Absolute: 2 10*3/uL (ref 0.7–3.1)
Lymphs: 27 %
MCH: 29.3 pg (ref 26.6–33.0)
MCHC: 32.3 g/dL (ref 31.5–35.7)
MCV: 91 fL (ref 79–97)
Monocytes Absolute: 0.5 10*3/uL (ref 0.1–0.9)
Monocytes: 7 %
Neutrophils Absolute: 4.6 10*3/uL (ref 1.4–7.0)
Neutrophils: 62 %
Platelets: 291 10*3/uL (ref 150–450)
RBC: 5.01 x10E6/uL (ref 3.77–5.28)
RDW: 12.8 % (ref 11.7–15.4)
WBC: 7.3 10*3/uL (ref 3.4–10.8)

## 2022-05-08 LAB — COMPREHENSIVE METABOLIC PANEL
ALT: 13 IU/L (ref 0–32)
AST: 21 IU/L (ref 0–40)
Albumin/Globulin Ratio: 2 (ref 1.2–2.2)
Albumin: 4.5 g/dL (ref 3.9–4.9)
Alkaline Phosphatase: 55 IU/L (ref 44–121)
BUN/Creatinine Ratio: 17 (ref 12–28)
BUN: 15 mg/dL (ref 8–27)
Bilirubin Total: 0.4 mg/dL (ref 0.0–1.2)
CO2: 23 mmol/L (ref 20–29)
Calcium: 9.9 mg/dL (ref 8.7–10.3)
Chloride: 101 mmol/L (ref 96–106)
Creatinine, Ser: 0.9 mg/dL (ref 0.57–1.00)
Globulin, Total: 2.2 g/dL (ref 1.5–4.5)
Glucose: 88 mg/dL (ref 70–99)
Potassium: 4.3 mmol/L (ref 3.5–5.2)
Sodium: 139 mmol/L (ref 134–144)
Total Protein: 6.7 g/dL (ref 6.0–8.5)
eGFR: 70 mL/min/{1.73_m2} (ref >59)

## 2022-05-08 LAB — LIPID PANEL
Chol/HDL Ratio: 3.5 ratio (ref 0.0–4.4)
Cholesterol, Total: 212 mg/dL — ABNORMAL HIGH (ref 100–199)
HDL: 61 mg/dL (ref >39)
LDL Chol Calc (NIH): 129 mg/dL — ABNORMAL HIGH (ref 0–99)
Triglycerides: 124 mg/dL (ref 0–149)
VLDL Cholesterol Cal: 22 mg/dL (ref 5–40)

## 2022-05-08 LAB — TSH: TSH: 1.11 u[IU]/mL (ref 0.450–4.500)

## 2022-05-27 ENCOUNTER — Ambulatory Visit
Admission: RE | Admit: 2022-05-27 | Discharge: 2022-05-27 | Disposition: A | Discharge status: Home / Self Care | Payer: Medicare PPO | Source: Ambulatory Visit | Attending: Internal Medicine | Admitting: Internal Medicine

## 2022-05-27 DIAGNOSIS — Z1231 Encounter for screening mammogram for malignant neoplasm of breast: Secondary | ICD-10-CM | POA: Diagnosis not present

## 2022-06-29 ENCOUNTER — Ambulatory Visit
Admission: RE | Admit: 2022-06-29 | Discharge: 2022-06-29 | Disposition: A | Discharge status: Home / Self Care | Payer: Medicare PPO | Source: Ambulatory Visit | Attending: Internal Medicine | Admitting: Internal Medicine

## 2022-06-29 DIAGNOSIS — Z780 Asymptomatic menopausal state: Secondary | ICD-10-CM | POA: Diagnosis not present

## 2022-06-29 DIAGNOSIS — M810 Age-related osteoporosis without current pathological fracture: Secondary | ICD-10-CM | POA: Diagnosis not present

## 2022-06-29 DIAGNOSIS — M85851 Other specified disorders of bone density and structure, right thigh: Secondary | ICD-10-CM | POA: Diagnosis not present

## 2022-07-12 ENCOUNTER — Ambulatory Visit: Payer: Medicare PPO

## 2022-07-16 ENCOUNTER — Ambulatory Visit (INDEPENDENT_AMBULATORY_CARE_PROVIDER_SITE_OTHER): Payer: Medicare PPO

## 2022-07-16 DIAGNOSIS — Z0000 Encounter for general adult medical examination without abnormal findings: Secondary | ICD-10-CM

## 2022-07-16 NOTE — Patient Instructions (Signed)

## 2022-07-16 NOTE — Progress Notes (Signed)
I connected with  [name] on 07/16/22 by a audio enabled telemedicine application and verified that I am speaking with the correct person using two identifiers.  Patient Location: Home  Provider Location: Office/Clinic  I discussed the limitations of evaluation and management by telemedicine. The patient expressed understanding and agreed to proceed.   Subjective:   Janet Cook is a 66 y.o. female who presents for Medicare Annual (Subsequent) preventive examination.  Review of Systems    Per HPI unless specifically indicated below.  Cardiac Risk Factors include: advanced age (>5mn, >>33women);female gender,   and mild hyperlipidemia.       Objective:       05/07/2022    9:52 AM 04/10/2021    9:14 AM 09/18/2020    9:06 AM  Vitals with BMI  Height '5\' 4"'$  '5\' 4"'$  '5\' 4"'$   Weight 136 lbs 147 lbs 153 lbs  BMI 23.33 [phone] Systolic [id number] Diastolic 72 72 80  Pulse 71 74 71    There were no vitals filed for this visit. There is no height or weight on file to calculate BMI.     07/08/2021    8:07 AM 07/07/2020    7:47 AM 06/11/2020    2:12 PM 01/07/2016    8:28 AM  Advanced Directives  Does Patient Have a Medical Advance Directive? Yes Yes Yes No  Type of [place] will  [place] will   Copy of [place] Chart? No - copy requested  No - copy requested   Would patient like information on creating a medical advance directive?    No - patient declined information    Current Medications (verified) Outpatient Encounter Medications as of 07/16/2022  Medication Sig   albuterol (VENTOLIN HFA) 108 (90 Base) MCG/ACT inhaler INHALE TWO PUFFS FOUR TIMES DAILY AS NEEDED   Calcium Carbonate-Vit D-Min (CALTRATE 600+D PLUS PO) Take by mouth.   fexofenadine (ALLEGRA) 60 MG tablet Take 60 mg by mouth 2 (two) times daily.   FLUoxetine (PROZAC) 40 MG capsule TAKE 1 CAPSULE BY MOUTH  EVERY DAY   traZODone (DESYREL) 50 MG tablet TAKE 1 TABLET BY MOUTH EVERYDAY AT BEDTIME   valACYclovir (VALTREX) 1000 MG tablet TAKE 2 TABLETS (2,000 MG TOTAL) BY MOUTH TWICE A DAY (Patient taking differently: Take 1,000 mg by mouth daily as needed.)   No facility-administered encounter medications on file as of 07/16/2022.    Allergies (verified) Patient has no known allergies.   History: Past Medical History:  Diagnosis Date   Anxiety 05/11/1982   Asthma 09/20/1983   Basal cell carcinoma 05/29/2021   back   Depression 05/11/1982   Mild hyperlipidemia    OP (osteoporosis)    SCCA (squamous cell carcinoma) of skin 03/30/2019   Past Surgical History:  Procedure Laterality Date   BREAST BIOPSY Right 2008   CORE - NEG   COLONOSCOPY  08/11/2014   COLONOSCOPY WITH PROPOFOL N/A 07/07/2020   Procedure: COLONOSCOPY WITH PROPOFOL;  Surgeon: VLin Landsman MD;  [facility]  Service: Gastroenterology;  Laterality: N/A;   LAPAROSCOPY     endometriosis   Family History  Problem Relation Age of Onset   CAD Father    Aortic aneurysm Father    Asthma Father    Heart failure Mother    Breast cancer Paternal Aunt    Social History   Socioeconomic History   Marital status:  Married    Spouse name: Cornie Mccomber   Number of children: 0   Years of education: Not on file   Highest education level: Not on file  Occupational History   Occupation: retired  Tobacco Use   Smoking status: Never   Smokeless tobacco: Never  Vaping Use   Vaping Use: Never used  Substance and Sexual Activity   Alcohol use: Yes    Alcohol/week: 1.0 standard drink of alcohol    Types: 1 Standard drinks or equivalent per week    Comment: occasional   Drug use: No   Sexual activity: Yes    Birth control/protection: Post-menopausal, None  Other Topics Concern   Not on file  Social History Narrative   2nd marriage; 1st husband passed away January 06, 2019 parkinson's and dementia   Social  Determinants of Health   Financial Resource Strain: Low Risk  (07/16/2022)   Overall Financial Resource Strain (CARDIA)    Difficulty of Paying Living Expenses: Not hard at all  Food Insecurity: No Food Insecurity (07/16/2022)   Hunger Vital Sign    Worried About Running Out of Food in the Last Year: Never true    Ran Out of Food in the Last Year: Never true  Transportation Needs: No Transportation Needs (07/16/2022)   PRAPARE - Hydrologist (Medical): No    Lack of Transportation (Non-Medical): No  Physical Activity: Sufficiently Active (07/16/2022)   Exercise Vital Sign    Days of Exercise per Week: 7 days    Minutes of Exercise per Session: 40 min  Stress: No Stress Concern Present (07/16/2022)   [place]    Feeling of Stress : Only a little  Social Connections: Socially Integrated (07/16/2022)   Social Connection and Isolation Panel [NHANES]    Frequency of Communication with Friends and Family: More than three times a week    Frequency of Social Gatherings with Friends and Family: Once a week    Attends Religious Services: 1 to 4 times per year    Active Member of [employer] or Organizations: Yes    Attends Music therapist: More than 4 times per year    Marital Status: Married    Tobacco Counseling Counseling given: No   Clinical Intake:  Pre-visit preparation completed: No  Pain : No/denies pain     Nutritional Status: BMI of 19-24  Normal Nutritional Risks: None Diabetes: No  How often do you need to have someone help you when you read instructions, pamphlets, or other written materials from your doctor or pharmacy?: 1 - Never  Diabetic? No Interpreter Needed?: No  Information entered by :: Donnie Mesa, CMA   Activities of Daily Living    07/16/2022    8:12 AM  In your present state of health, do you have any difficulty performing the following  activities:  Hearing? 0  Vision? 1  Comment 04/2022, [facility]  Difficulty concentrating or making decisions? 0  Walking or climbing stairs? 0  Dressing or bathing? 0  Doing errands, shopping? 0    Patient Care Team: Glean Hess, MD as PCP - General (Internal Medicine) Dermatology, [place], Tally Due, MD as Consulting Physician (Gastroenterology)  Indicate any recent Medical Services you may have received from other than [facility] providers in the past year (date may be approximate).    No hospitalization in the past 12 months.  Assessment:   This is a routine wellness examination  for [employer].  Hearing/Vision screen Denies any hearing issues. Denies any new vision concerns. Wear glasses, Annual [facility] Exam done at [facility].  Dietary issues and exercise activities discussed: Current Exercise Habits: Structured exercise class, Type of exercise: walking, Time (Minutes): 40, Frequency (Times/Week): 7, Weekly Exercise (Minutes/Week): 280, Intensity: Moderate   Goals Addressed             This Visit's Progress    Stay Active and Independent           Why is this important?   Regular activity or exercise is important to managing back pain.  Activity helps to keep your muscles strong.  You will sleep better and feel more relaxed.  You will have more energy and feel less stressed.  If you are not active now, start slowly. Little changes make a big difference.  Rest, but not too much.  Stay as active as you can and listen to your body's signals.            Depression Screen    05/07/2022    9:59 AM 07/08/2021    8:06 AM 04/10/2021    9:26 AM 04/10/2021    9:23 AM 09/18/2020    9:07 AM 06/11/2020    2:10 PM 04/08/2020    9:53 AM  PHQ 2/9 Scores  PHQ - 2 Score 0 0 0 0 0 0 0  PHQ- 9 Score 0  1 1 0  0    Fall Risk    07/16/2022    8:13 AM 05/07/2022    9:59 AM 07/08/2021    8:07 AM 04/10/2021    9:27 AM 04/10/2021    9:23 AM  Fall Risk   Falls  in the past year? 0 0 0 0 0  Number falls in past yr: 0 0 0 0   Injury with Fall? 0 0 0 0   Risk for fall due to : No Fall Risks No Fall Risks No Fall Risks    Follow up Falls evaluation completed Falls evaluation completed Falls prevention discussed Falls evaluation completed Falls evaluation completed    [place]:  Any stairs in or around the home? No  If so, are there any without handrails? No Home free of loose throw rugs in walkways, pet beds, electrical cords, etc? Yes  Adequate lighting in your home to reduce risk of falls? Yes   ASSISTIVE DEVICES UTILIZED TO PREVENT FALLS:  Life alert? No  Use of a cane, walker or w/c? No  Grab bars in the bathroom? Yes  Shower chair or bench in shower? Yes  Elevated toilet seat or a handicapped toilet? Yes   TIMED UP AND GO:  Was the test performed?  unable to perform, virtual appt .  Cognitive Function:        07/16/2022    8:14 AM  6CIT Screen  What Year? 0 points  What month? 0 points  What time? 0 points  Count back from 20 0 points  Months in reverse 0 points  Repeat phrase 4 points  Total Score 4 points    Immunizations Immunization History  Administered Date(s) Administered   Fluad Quad(high Dose 65+) 07/13/2020, 06/28/2022   Influenza, High Dose Seasonal PF 07/27/2019, 07/02/2021   Influenza,inj,Quad PF,6+ Mos 07/17/2017, 07/24/2018   Influenza-Unspecified 07/14/2015, 07/27/2019   PFIZER(Purple Top)SARS-COV-2 Vaccination 11/23/2019, 12/19/2019, 08/11/2020, 02/01/2021   Pfizer Covid-19 Vaccine Bivalent Booster 95yr & up 07/02/2021   Pneumococcal Conjugate-13 04/08/2020  Pneumococcal Polysaccharide-23 04/10/2021   Tdap 08/25/2011   Zoster Recombinat (Shingrix) 07/24/2018, 12/01/2018   Zoster, Live 11/18/2014    TDAP status: Due, Education has been provided regarding the importance of this vaccine. Advised may receive this vaccine at local pharmacy or Health Dept. Aware to  provide a copy of the vaccination record if obtained from local pharmacy or Health Dept. Verbalized acceptance and understanding.  Flu Vaccine status: Up to date  Pneumococcal vaccine status: Up to date  Covid-19 vaccine status: Information provided on how to obtain vaccines.   Qualifies for Shingles Vaccine? Yes   Zostavax completed Yes   Shingrix Completed?: Yes  Screening Tests Health Maintenance  Topic Date Due   TETANUS/TDAP  08/24/2021   COVID-19 Vaccine (6 - Pfizer risk series) 08/27/2021   MAMMOGRAM  05/28/2023   DEXA SCAN  06/29/2024   COLONOSCOPY (Pts 45-69yr Insurance coverage will need to be confirmed)  07/08/2027   Pneumonia Vaccine 68 Years old  Completed   INFLUENZA VACCINE  Completed   Zoster Vaccines- Shingrix  Completed   Hepatitis C Screening  Addressed   HPV VACCINES  Aged Out    Health Maintenance  Health Maintenance Due  Topic Date Due   TETANUS/TDAP  08/24/2021   COVID-19 Vaccine (6 - Pfizer risk series) 08/27/2021    Colorectal cancer screening: Type of screening: Colonoscopy. Completed 07/07/2020. Repeat every 7 years  Mammogram status: Completed 05/27/2022. Repeat every year  DEXA Scan: 06/29/2022  Lung Cancer Screening: (Low Dose CT Chest recommended if Age 68-80years, 30 pack-year currently smoking OR have quit w/in 15years.) does not qualify.    Additional Screening:  Hepatitis C Screening: does qualify; Completed 12/14/2019  Vision Screening: Recommended annual ophthalmology exams for early detection of glaucoma and other disorders of the eye. Is the patient up to date with their annual eye exam?  Yes  Who is the provider or what is the name of the [facility] which the patient [facility] If pt is not established with a provider, would they like to be referred to a provider to establish care? No .   Dental Screening: Recommended annual dental exams for proper oral hygiene  Community Resource  Referral / Chronic Care Management: CRR required this visit?  No   CCM required this visit?  No      Plan:     I have personally reviewed and noted the following in the patient's chart:   Medical and social history Use of alcohol, tobacco or illicit drugs  Current medications and supplements including opioid prescriptions. Patient is not currently taking opioid prescriptions. Functional ability and status Nutritional status Physical activity Advanced directives List of other physicians Hospitalizations, surgeries, and ER visits in previous 12 months Vitals Screenings to include cognitive, depression, and falls Referrals and appointments  In addition, I have reviewed and discussed with patient certain preventive protocols, quality metrics, and best practice recommendations. A written personalized care plan for preventive services as well as general preventive health recommendations were provided to patient.    Ms. AZellner, Thank you for taking time to come for your Medicare Wellness Visit. I appreciate your ongoing commitment to your health goals. Please review the following plan we discussed and let me know if I can assist you in the future.   These are the goals we discussed:  Goals      Increase physical activity     Pt states she would like to increase physical  activity over the next year.      Stay Active and Independent         Why is this important?   Regular activity or exercise is important to managing back pain.  Activity helps to keep your muscles strong.  You will sleep better and feel more relaxed.  You will have more energy and feel less stressed.  If you are not active now, start slowly. Little changes make a big difference.  Rest, but not too much.  Stay as active as you can and listen to your body's signals.             This is a list of the screening recommended for you and due dates:  Health Maintenance  Topic Date Due   Tetanus Vaccine   08/24/2021   COVID-19 Vaccine (6 - Pfizer risk series) 08/27/2021   Mammogram  05/28/2023   DEXA scan (bone density measurement)  06/29/2024   Colon Cancer Screening  07/08/2027   Pneumonia Vaccine  Completed   Flu Shot  Completed   Zoster (Shingles) Vaccine  Completed   Hepatitis C Screening: USPSTF Recommendation to screen - Ages 35-79 yo.  Addressed   HPV Vaccine  Aged [address] [place]   07/16/2022   Nurse Notes: Approximately 30 minute Non-Face-to-face visit

## 2022-08-01 ENCOUNTER — Other Ambulatory Visit: Payer: Self-pay | Admitting: Internal Medicine

## 2022-08-03 NOTE — Telephone Encounter (Signed)
Requested Prescriptions  Pending Prescriptions Disp Refills  . traZODone (DESYREL) 50 MG tablet [Pharmacy Med Name: TRAZODONE 50 MG TABLET] 90 tablet 0    Sig: TAKE 1 TABLET BY MOUTH EVERYDAY AT BEDTIME     Psychiatry: Antidepressants - Serotonin Modulator Passed - 08/01/2022  9:31 AM      Passed - Completed PHQ-2 or PHQ-9 in the last 360 days      Passed - Valid encounter within last 6 months    Recent Outpatient Visits          2 months ago Annual physical exam   [place] [facility] at [facility], Jesse Sans, MD   1 year ago Annual physical exam   [place] [facility] at [facility], Jesse Sans, MD   1 year ago Chronic recurrent major depressive disorder [facility])   [place] [facility] at [facility], Jesse Sans, MD   2 years ago Annual physical exam   [place] [facility] at [facility], Jesse Sans, MD   2 years ago BV (bacterial vaginosis)   [place] [facility] at [facility], Jesse Sans, MD      Future Appointments            In 9 months Army Melia, Jesse Sans, MD [place] [facility] at [facility], [facility]

## 2022-08-24 ENCOUNTER — Encounter: Payer: Self-pay | Admitting: Internal Medicine

## 2022-08-25 ENCOUNTER — Telehealth (INDEPENDENT_AMBULATORY_CARE_PROVIDER_SITE_OTHER): Payer: Medicare PPO | Admitting: Internal Medicine

## 2022-08-25 ENCOUNTER — Encounter: Payer: Self-pay | Admitting: *Deleted

## 2022-08-25 ENCOUNTER — Encounter: Payer: Self-pay | Admitting: Internal Medicine

## 2022-08-25 ENCOUNTER — Ambulatory Visit: Payer: Self-pay | Admitting: *Deleted

## 2022-08-25 VITALS — Ht 64.0 in

## 2022-08-25 DIAGNOSIS — U071 COVID-19: Secondary | ICD-10-CM

## 2022-08-25 DIAGNOSIS — R051 Acute cough: Secondary | ICD-10-CM | POA: Diagnosis not present

## 2022-08-25 MED ORDER — ALBUTEROL SULFATE HFA 108 (90 BASE) MCG/ACT IN AERS: INHALATION_SPRAY | RESPIRATORY_TRACT | 1 refills | Status: DC

## 2022-08-25 MED ORDER — HYDROCODONE BIT-HOMATROP MBR 5-1.5 MG/5ML PO SOLN: 5.0000 mL | Freq: Four times a day (QID) | ORAL | 0 refills | Status: AC | PRN

## 2022-08-25 MED ORDER — AMOXICILLIN-POT CLAVULANATE 875-125 MG PO TABS: 1.0000 | ORAL_TABLET | Freq: Two times a day (BID) | ORAL | 0 refills | Status: AC

## 2022-08-25 MED ORDER — PREDNISONE 10 MG PO TABS: 10.0000 mg | ORAL_TABLET | ORAL | 0 refills | Status: AC

## 2022-08-25 NOTE — Telephone Encounter (Signed)
Patient scheduled for appt today at 4 pm.

## 2022-08-25 NOTE — Telephone Encounter (Signed)
Noted  KP 

## 2022-08-25 NOTE — Telephone Encounter (Signed)
This encounter was created in error - please disregard.

## 2022-08-25 NOTE — Telephone Encounter (Addendum)
  Chief Complaint: Cough, SOB Symptoms: Productive cough since Nov. 7th. Dark phlegm initially, now yellow. SOB at rest at times, wheezing at times. Speech non-halting during call, no audible wheeze.Chest congestion, body aches, sore throat "But better this week, mild." LGT last week. Not presently. Frequency: 08/17/22 Pertinent Negatives: Patient denies  Disposition: '[]'$ ED /'[]'$ Urgent Care (no appt availability in office) / '[x]'$ Appointment(In office/virtual)/ '[]'$  [place] [facility]/ '[]'$ Home Care/ '[]'$ Refused Recommended Disposition /'[]'$ [facility] [facility]/ '[]'$  Follow-up with PCP Additional Notes: Appt secured for 4 today,first available.  placed on wait list. Advised ED for any worsening SOB, wheezing. Pt verbalizes understanding. Also advised Covid testing, states she will do so, secure a home test, if positive will alert practice.    Pt returned call, did test positive for Covid. Consulted with Levada Dy, appt at 4 today to be MyChart virtual. Called pt and left VM. Will attempt to reach again to ensure pt received message.

## 2022-08-25 NOTE — Progress Notes (Signed)
Date:  08/25/2022   Name:  Janet Cook   DOB:  03-02-1956   MRN:  [id number]  This encounter was conducted via video encounter due to the need for social distancing in light of the Covid-19 pandemic.  The patient was correctly identified.  I advised that I am conducting the visit from a secure room in my office at [facility] clinic.  The patient is located home. The limitations of this form of encounter were discussed with the patient and he/she agreed to proceed.  Some vital signs will be absent.  Visit commended with video but transferred to telephone due to very poor audio connection.  Chief Complaint: Covid Positive (Tested pos today has been having symptoms for 8 days, )  URI  This is a new problem. Episode onset: 08/17/22 felt bad. The problem has been gradually worsening. There has been no fever (fever started several days later 6 days 100). Associated symptoms include abdominal pain, chest pain, congestion, coughing, diarrhea, a rash (under breast - not itching but painful), rhinorrhea, sinus pain, sneezing, a sore throat and wheezing. Treatments tried: mucinex. The treatment provided no relief.  Cough This is a new problem. The current episode started in the past 7 days. The problem has been gradually worsening. The problem occurs every few minutes. The cough is Productive of sputum (dark yellow). Associated symptoms include chest pain, a rash (under breast - not itching but painful), rhinorrhea, a sore throat and wheezing.    Lab Results  Component Value Date   NA 139 05/07/2022   K 4.3 05/07/2022   CO2 23 05/07/2022   GLUCOSE 88 05/07/2022   BUN 15 05/07/2022   CREATININE 0.90 05/07/2022   CALCIUM 9.9 05/07/2022   EGFR 70 05/07/2022   GFRNONAA 73 04/08/2020   Lab Results  Component Value Date   CHOL 212 (H) 05/07/2022   HDL 61 05/07/2022   LDLCALC 129 (H) 05/07/2022   TRIG 124 05/07/2022   CHOLHDL 3.5 05/07/2022   Lab Results  Component Value Date   TSH 1.110  05/07/2022   No results found for: "HGBA1C" Lab Results  Component Value Date   WBC 7.3 05/07/2022   HGB 14.7 05/07/2022   HCT 45.5 05/07/2022   MCV 91 05/07/2022   PLT 291 05/07/2022   Lab Results  Component Value Date   ALT 13 05/07/2022   AST 21 05/07/2022   ALKPHOS 55 05/07/2022   BILITOT 0.4 05/07/2022   Lab Results  Component Value Date   VD25OH 44.2 04/10/2021     Review of Systems  HENT:  Positive for congestion, rhinorrhea, sinus pain, sneezing and sore throat.   Respiratory:  Positive for cough and wheezing.   Cardiovascular:  Positive for chest pain.  Gastrointestinal:  Positive for abdominal pain and diarrhea.  Skin:  Positive for rash (under breast - not itching but painful).    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   Major depressive disorder in partial remission ([place]) 08/11/2015   OP (osteoporosis) 08/11/2015   Idiopathic insomnia 08/11/2015   Avitaminosis D 08/11/2015    No Known Allergies  Past Surgical History:  Procedure Laterality Date   BREAST BIOPSY Right 2008   CORE - NEG  COLONOSCOPY  08/11/2014   COLONOSCOPY WITH PROPOFOL N/A 07/07/2020   Procedure: COLONOSCOPY WITH PROPOFOL;  Surgeon: Lin Landsman, MD;  Location: [facility] ENDOSCOPY;  Service: Gastroenterology;  Laterality: N/A;   LAPAROSCOPY     endometriosis    Social History   Tobacco Use   Smoking status: Never   Smokeless tobacco: Never  Vaping Use   Vaping Use: Never used  Substance Use Topics   Alcohol use: Yes    Alcohol/week: 1.0 standard drink of alcohol    Types: 1 Standard drinks or equivalent per week    Comment: occasional   Drug use: No     Medication list has been reviewed and updated.  Current Meds   Medication Sig   amoxicillin-clavulanate (AUGMENTIN) 875-125 MG tablet Take 1 tablet by mouth 2 (two) times daily for 10 days.   Calcium Carbonate-Vit D-Min (CALTRATE 600+D PLUS PO) Take by mouth.   fexofenadine (ALLEGRA) 60 MG tablet Take 60 mg by mouth 2 (two) times daily.   FLUoxetine (PROZAC) 40 MG capsule TAKE 1 CAPSULE BY MOUTH EVERY DAY   HYDROcodone bit-homatropine (HYCODAN) 5-1.5 MG/5ML syrup Take 5 mLs by mouth every 6 (six) hours as needed for up to 10 days for cough.   predniSONE (DELTASONE) 10 MG tablet Take 1 tablet (10 mg total) by mouth as directed for 6 days. Take 6,5,4,3,2,1 then stop   traZODone (DESYREL) 50 MG tablet TAKE 1 TABLET BY MOUTH EVERYDAY AT BEDTIME   valACYclovir (VALTREX) 1000 MG tablet TAKE 2 TABLETS (2,000 MG TOTAL) BY MOUTH TWICE A DAY (Patient taking differently: Take 1,000 mg by mouth daily as needed.)   [DISCONTINUED] albuterol (VENTOLIN HFA) 108 (90 Base) MCG/ACT inhaler INHALE TWO PUFFS FOUR TIMES DAILY AS NEEDED       08/25/2022    2:56 PM 05/07/2022    9:59 AM 04/10/2021    9:26 AM 04/10/2021    9:23 AM  GAD 7 : Generalized Anxiety Score  Nervous, Anxious, on Edge 0 0 0 0  Control/stop worrying 0 0 0 0  Worry too much - different things 0 0 0 0  Trouble relaxing 0 0 0 0  Restless 0 0 0 0  Easily annoyed or irritable 0 0 0 0  Afraid - awful might happen 0 0 0 0  Total GAD 7 Score 0 0 0 0  Anxiety Difficulty Not difficult at all Not difficult at all Not difficult at all        08/25/2022    2:56 PM 05/07/2022    9:59 AM 07/08/2021    8:06 AM  Depression screen PHQ 2/9  Decreased Interest 0 0 0  Down, Depressed, Hopeless 0 0 0  PHQ - 2 Score 0 0 0  Altered sleeping 1 0   Tired, decreased energy 0 0   Change in appetite 1 0   Feeling bad or failure about yourself  0 0   Trouble concentrating 0 0   Moving slowly or fidgety/restless 0 0   Suicidal thoughts 0 0   PHQ-9 Score 2 0   Difficult doing work/chores Not difficult at all Not  difficult at all     BP Readings from Last 3 Encounters:  05/07/22 118/72  04/10/21 110/72  09/18/20 128/80    Physical Exam Constitutional:      Appearance: Normal appearance. She is ill-appearing.  Pulmonary:     Effort: Pulmonary effort is normal.     Comments: But frequent hard loose cough during exam Neurological:  Mental Status: She is alert.  Psychiatric:        Mood and Affect: Mood normal.        Behavior: Behavior normal.     Wt Readings from Last 3 Encounters:  05/07/22 136 lb (61.7 kg)  04/10/21 147 lb (66.7 kg)  09/18/20 153 lb (69.4 kg)    Ht _0  (1.626 m)   BMI 23.34 kg/m   Assessment and Plan: 1. Acute cough Acute bronchitis after Covid infection, with severe cough Will refill Albuterol and treat with antibiotics, steroid taper and cough medications - albuterol (VENTOLIN HFA) 108 (90 Base) MCG/ACT inhaler; INHALE TWO PUFFS FOUR TIMES DAILY AS NEEDED  Dispense: 18 each; Refill: 1 - amoxicillin-clavulanate (AUGMENTIN) 875-125 MG tablet; Take 1 tablet by mouth 2 (two) times daily for 10 days.  Dispense: 20 tablet; Refill: 0 - HYDROcodone bit-homatropine (HYCODAN) 5-1.5 MG/5ML syrup; Take 5 mLs by mouth every 6 (six) hours as needed for up to 10 days for cough.  Dispense: 120 mL; Refill: 0 - predniSONE (DELTASONE) 10 MG tablet; Take 1 tablet (10 mg total) by mouth as directed for 6 days. Take 6,5,4,3,2,1 then stop  Dispense: 21 tablet; Refill: 0  2. COVID-19 Tested positive 7 days after onset of symptoms No longer qualifies for antiviral therapy  I spent 9 minutes on this encounter, 25% by video, 75% by phone due to poor connection. Partially dictated using Editor, commissioning. Any errors are unintentional.  Halina Maidens, MD [facility]  08/25/2022

## 2022-08-25 NOTE — Telephone Encounter (Signed)
Patient returning call to report she got the message to not come in to the office and can do a  my chart VV. Patient verbalized understanding.

## 2022-08-25 NOTE — Telephone Encounter (Signed)
Reason for Disposition  Wheezing is present  Answer Assessment - Initial Assessment Questions 1. ONSET: "When did the cough begin?"      Nov. 7th 2. SEVERITY: "How bad is the cough today?"      Keeps awake at night 3. SPUTUM: "Describe the color of your sputum" (none, dry cough; clear, white, yellow, green)     Dark last week, now more yellow. 4. HEMOPTYSIS: "Are you coughing up any blood?" If so ask: "How much?" (flecks, streaks, tablespoons, etc.)     no 5. DIFFICULTY BREATHING: "Are you having difficulty breathing?" If Yes, ask: "How bad is it?" (e.g., mild, moderate, severe)    - MILD: No SOB at rest, mild SOB with walking, speaks normally in sentences, can lie down, no retractions, pulse < 100.    - MODERATE: SOB at rest, SOB with minimal exertion and prefers to sit, cannot lie down flat, speaks in phrases, mild retractions, audible wheezing, pulse 100-120.    - SEVERE: Very SOB at rest, speaks in single words, struggling to breathe, sitting hunched forward, retractions, pulse > 120      Moderate, wheezing 6. FEVER: "Do you have a fever?" If Yes, ask: "What is your temperature, how was it measured, and when did it start?"     Last week 100.6, not presently 7. CARDIAC HISTORY: "Do you have any history of heart disease?" (e.g., heart attack, congestive heart failure)      *No Answer* 8. LUNG HISTORY: "Do you have any history of lung disease?"  (e.g., pulmonary embolus, asthma, emphysema)     *No Answer* 9. PE RISK FACTORS: "Do you have a history of blood clots?" (or: recent major surgery, recent prolonged travel, bedridden)     *No Answer* 10. OTHER SYMPTOMS: "Do you have any other symptoms?" (e.g., runny nose, wheezing, chest pain)       Wheezing, body aches, sore throat (Better than last week.)  Protocols used: Cough - Acute Productive-A-AH

## 2022-08-30 ENCOUNTER — Ambulatory Visit: Payer: Self-pay | Admitting: *Deleted

## 2022-08-30 NOTE — Telephone Encounter (Signed)
Message from Erick Blinks sent at 08/30/2022 11:01 AM EST  Summary: Diarrhea in reaction to Rx   Pt believes she is having a reaction to her amoxicillin, she says she has diarrhea for 3 days. It is immediate every time she eats  Best contact: [phone]          Call History   Type Contact Phone/Fax User  08/30/2022 10:59 AM EST Phone (Incoming) Janet Cook, Janet Cook (Self) [phone] Jerilynn Mages) Marlan Palau    Reason for Disposition  MODERATE diarrhea (e.g., 4-6 times / day more than normal)  Answer Assessment - Initial Assessment Questions 1. ANTIBIOTIC: "What antibiotic are you taking?" "How many times per day?"     Amoxicillin, prednisone too for bronchitis.   The bronchitis is better.   The diarrhea has been going on for 3 days now.   Every time I eat it goes straight through me.   It's very watery.   Did a video visit with Dr. Army Melia on 08/25/2022 and started on the Amoxicillin for bronchitis.   2. ANTIBIOTIC ONSET: "When was the antibiotic started?"      Amoxicillin started 08/25/2022       Did not take the antibiotic this morning. I wanted to wait and see what Dr. Army Melia advised.  3. DIARRHEA SEVERITY: "How bad is the diarrhea?" "How many more stools have you had in the past 24 hours than normal?"    - NO DIARRHEA (SCALE 0)   - MILD (SCALE 1-3): Few loose or mushy BMs; increase of 1-3 stools over normal daily number of stools; mild increase in ostomy output.   -  MODERATE (SCALE 4-7): Increase of 4-6 stools daily over normal; moderate increase in ostomy output. * SEVERE (SCALE 8-10; OR 'WORST POSSIBLE'): Increase of 7 or more stools daily over normal; moderate increase in ostomy output; incontinence.     Every time I eat it goes straight through me.   It's watery diarrhea. 4. ONSET: "When did the diarrhea begin?"      3 days ago  The congestion is much better.   coughing up clear mucus now instead of the thick stuff.  The antibiotic has helped.   5. BM CONSISTENCY:  "How loose or watery is the diarrhea?"      Watery  6. VOMITING: "Are you also vomiting?" If Yes, ask: "How many times in the past 24 hours?"      No 7. ABDOMEN PAIN: "Are you having any abdomen pain?" If Yes, ask: "What does it feel like?" (e.g., crampy, dull, intermittent, constant)      No 8. ABDOMEN PAIN SEVERITY: If present, ask: "How bad is the pain?"  (e.g., Scale 1-10; mild, moderate, or severe)   - MILD (1-3): doesn't interfere with normal activities, abdomen soft and not tender to touch    - MODERATE (4-7): interferes with normal activities or awakens from sleep, abdomen tender to touch    - SEVERE (8-10): excruciating pain, doubled over, unable to do any normal activities       None 9. ORAL INTAKE: If vomiting, "Have you been able to drink liquids?" "How much liquids have you had in the past 24 hours?"     Drinking ok 10. HYDRATION: "Any signs of dehydration?" (e.g., dry mouth [not just dry lips], too weak tryo stand, dizziness, new weight loss) "When did you last urinate?"       I feel a little dry.   I've not eaten a lot but everything goes straight through  me. 11. EXPOSURE: "Have you traveled to a foreign country recently?" "Have you been exposed to anyone with diarrhea?" "Could you have eaten any food that was spoiled?"       Not asked 12. OTHER SYMPTOMS: "Do you have any other symptoms?" (e.g., fever, blood in stool)       Still coughing but nearly as bad. 13. PREGNANCY: "Is there any chance you are pregnant?" "When was your last menstrual period?"       N/A  Protocols used: Diarrhea on Antibiotics-A-AH

## 2022-08-30 NOTE — Telephone Encounter (Signed)
Patient informed.  CMcAdoo

## 2022-08-30 NOTE — Telephone Encounter (Signed)
  Chief Complaint: Diarrhea from the Amoxicillin she is on for bronchitis.   Video visit done 08/25/2022 with Dr. Army Melia. Symptoms: Watery diarrhea every time she eats anything.  Cough and congestion are better.   Frequency: For last 3 days having watery diarrhea Pertinent Negatives: Patient denies coughing up think mucus anymore.   It's clear now.   Denies nausea or vomiting. Disposition: '[]'$ ED /'[]'$ Urgent Care (no appt availability in office) / '[]'$ Appointment(In office/virtual)/ '[]'$  [place] [facility]/ '[]'$ Home Care/ '[]'$ Refused Recommended Disposition /'[]'$ [place] [facility]/ '[x]'$  Follow-up with PCP Additional Notes: Message sent to Dr. Army Melia.   Pt. Agreeable to someone calling her back.   She did not take the antibiotic this morning.   Wanted to wait and see what Dr. Army Melia advised.

## 2022-10-24 ENCOUNTER — Other Ambulatory Visit: Payer: Self-pay | Admitting: Internal Medicine

## 2022-11-17 ENCOUNTER — Other Ambulatory Visit: Payer: Self-pay | Admitting: Internal Medicine

## 2022-11-17 DIAGNOSIS — R051 Acute cough: Secondary | ICD-10-CM

## 2023-01-13 ENCOUNTER — Other Ambulatory Visit: Payer: Self-pay | Admitting: Internal Medicine

## 2023-01-13 DIAGNOSIS — R051 Acute cough: Secondary | ICD-10-CM

## 2023-01-13 NOTE — Telephone Encounter (Signed)
Requested Prescriptions  Pending Prescriptions Disp Refills   albuterol (VENTOLIN HFA) 108 (90 Base) MCG/ACT inhaler [Pharmacy Med Name: ALBUTEROL HFA (VENTOLIN) INH] 18 each 1    Sig: INHALE TWO PUFFS FOUR TIMES DAILY AS NEEDED     Pulmonology:  Beta Agonists 2 Passed - 01/13/2023  2:21 AM      Passed - Last BP in normal range    BP Readings from Last 1 Encounters:  05/07/22 118/72         Passed - Last Heart Rate in normal range    Pulse Readings from Last 1 Encounters:  05/07/22 71         Passed - Valid encounter within last 12 months    Recent Outpatient Visits           4 months ago Acute cough   [facility] [place], Jesse Sans, MD   8 months ago Annual physical exam   [place] at [facility], Jesse Sans, MD   1 year ago Annual physical exam   [facility] at [facility], Jesse Sans, MD   2 years ago Chronic recurrent major depressive disorder [facility])   [place] [facility] & Sports Medicine at [facility], Jesse Sans, MD   2 years ago Annual physical exam   [place] at [facility], Jesse Sans, MD       Future Appointments             In 4 months Army Melia, Jesse Sans, MD [place] at [facility], [facility]

## 2023-01-19 ENCOUNTER — Other Ambulatory Visit: Payer: Self-pay | Admitting: Internal Medicine

## 2023-01-20 NOTE — Telephone Encounter (Signed)
Requested Prescriptions  Pending Prescriptions Disp Refills   traZODone (DESYREL) 50 MG tablet [Pharmacy Med Name: TRAZODONE 50 MG TABLET] 90 tablet 0    Sig: TAKE 1 TABLET BY MOUTH EVERYDAY AT BEDTIME     Psychiatry: Antidepressants - Serotonin Modulator Passed - 01/19/2023 11:16 AM      Passed - Completed PHQ-2 or PHQ-9 in the last 360 days      Passed - Valid encounter within last 6 months    Recent Outpatient Visits           4 months ago Acute cough   [place] [facility] at [facility], Nyoka Cowden, MD   8 months ago Annual physical exam   [facility] & [facility] Medicine at [facility], Nyoka Cowden, MD   1 year ago Annual physical exam   [facility] at [facility], Nyoka Cowden, MD   2 years ago Chronic recurrent major depressive disorder [facility])   [place] Primary Care & Sports Medicine at [facility], Nyoka Cowden, MD   2 years ago Annual physical exam   [facility] Medicine at [facility], Nyoka Cowden, MD       Future Appointments             In 4 months Judithann Graves, Nyoka Cowden, MD [facility] Medicine at [facility], [facility]

## 2023-01-31 DIAGNOSIS — S70361A Insect bite (nonvenomous), right thigh, initial encounter: Secondary | ICD-10-CM | POA: Diagnosis not present

## 2023-01-31 DIAGNOSIS — D2262 Melanocytic nevi of left upper limb, including shoulder: Secondary | ICD-10-CM | POA: Diagnosis not present

## 2023-01-31 DIAGNOSIS — S70362A Insect bite (nonvenomous), left thigh, initial encounter: Secondary | ICD-10-CM | POA: Diagnosis not present

## 2023-01-31 DIAGNOSIS — L814 Other melanin hyperpigmentation: Secondary | ICD-10-CM | POA: Diagnosis not present

## 2023-01-31 DIAGNOSIS — D2271 Melanocytic nevi of right lower limb, including hip: Secondary | ICD-10-CM | POA: Diagnosis not present

## 2023-01-31 DIAGNOSIS — D2272 Melanocytic nevi of left lower limb, including hip: Secondary | ICD-10-CM | POA: Diagnosis not present

## 2023-01-31 DIAGNOSIS — D225 Melanocytic nevi of trunk: Secondary | ICD-10-CM | POA: Diagnosis not present

## 2023-01-31 DIAGNOSIS — D2261 Melanocytic nevi of right upper limb, including shoulder: Secondary | ICD-10-CM | POA: Diagnosis not present

## 2023-01-31 DIAGNOSIS — L821 Other seborrheic keratosis: Secondary | ICD-10-CM | POA: Diagnosis not present

## 2023-04-13 ENCOUNTER — Other Ambulatory Visit: Payer: Self-pay | Admitting: Internal Medicine

## 2023-04-13 DIAGNOSIS — Z1231 Encounter for screening mammogram for malignant neoplasm of breast: Secondary | ICD-10-CM

## 2023-05-02 ENCOUNTER — Encounter: Payer: Self-pay | Admitting: Internal Medicine

## 2023-05-02 ENCOUNTER — Encounter: Payer: Self-pay | Admitting: Physician Assistant

## 2023-05-02 ENCOUNTER — Ambulatory Visit (INDEPENDENT_AMBULATORY_CARE_PROVIDER_SITE_OTHER): Payer: Medicare PPO | Admitting: Physician Assistant

## 2023-05-02 ENCOUNTER — Ambulatory Visit: Payer: Self-pay | Admitting: *Deleted

## 2023-05-02 VITALS — BP 122/86 | HR 85 | Temp 97.9°F | Ht 64.0 in | Wt 141.0 lb

## 2023-05-02 DIAGNOSIS — J4521 Mild intermittent asthma with (acute) exacerbation: Secondary | ICD-10-CM

## 2023-05-02 MED ORDER — BENZONATATE 100 MG PO CAPS: 200.0000 mg | ORAL_CAPSULE | Freq: Three times a day (TID) | ORAL | 0 refills | Status: AC

## 2023-05-02 MED ORDER — PREDNISONE 10 MG PO TABS
ORAL_TABLET | ORAL | 0 refills | Status: AC
Start: 2023-05-02 — End: 2023-05-08

## 2023-05-02 NOTE — Telephone Encounter (Signed)
Noted pt has an appt today.  KP

## 2023-05-02 NOTE — Progress Notes (Signed)
Date:  05/02/2023   Name:  Janet Cook   DOB:  07-10-1956   MRN:  [id number]   Chief Complaint: Cough (2 negative covid test )  Cough This is a new problem. Episode onset: X1 week. The problem has been gradually improving. The problem occurs hourly. The cough is Productive of sputum (brown/pink mucous). Associated symptoms include shortness of breath. Pertinent negatives include no chest pain or fever. Associated symptoms comments: congestion. Exacerbated by: when moving around. She has tried steroid inhaler (mucinex, robittusin) for the symptoms. The treatment provided mild relief. Her past medical history is significant for asthma.   Janet Cook is a very pleasant 67 year old female new to me today, typically sees my colleague Dr. Bari Edward, MD, here for evaluation of productive cough and chest congestion for the past 6 days as described above with no associated fever.  Over the last 48 hours, she reports some symptomatic improvement, but is bothered by the persistence of symptoms.  She states a steroid pack usually helps.   Medication list has been reviewed and updated.  Current Meds  Medication Sig   albuterol (VENTOLIN HFA) 108 (90 Base) MCG/ACT inhaler INHALE TWO PUFFS FOUR TIMES DAILY AS NEEDED   benzonatate (TESSALON) 100 MG capsule Take 2 capsules (200 mg total) by mouth 3 (three) times daily for 10 days.   Calcium Carbonate-Vit D-Min (CALTRATE 600+D PLUS PO) Take by mouth.   fexofenadine (ALLEGRA) 60 MG tablet Take 60 mg by mouth 2 (two) times daily.   FLUoxetine (PROZAC) 40 MG capsule TAKE 1 CAPSULE BY MOUTH EVERY DAY   predniSONE (DELTASONE) 10 MG tablet Take 6 tablets (60 mg total) by mouth daily with breakfast for 1 day, THEN 5 tablets (50 mg total) daily with breakfast for 1 day, THEN 4 tablets (40 mg total) daily with breakfast for 1 day, THEN 3 tablets (30 mg total) daily with breakfast for 1 day, THEN 2 tablets (20 mg total) daily with breakfast for 1 day, THEN 1 tablet  (10 mg total) daily with breakfast for 1 day.   traZODone (DESYREL) 50 MG tablet TAKE 1 TABLET BY MOUTH EVERYDAY AT BEDTIME   valACYclovir (VALTREX) 1000 MG tablet TAKE 2 TABLETS (2,000 MG TOTAL) BY MOUTH TWICE A DAY (Patient taking differently: Take 1,000 mg by mouth daily as needed.)     Review of Systems  Constitutional:  Negative for fatigue and fever.  Respiratory:  Positive for cough and shortness of breath. Negative for chest tightness.   Cardiovascular:  Negative for chest pain and palpitations.  Gastrointestinal:  Negative for abdominal pain.    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) 08/11/2015   OP (osteoporosis) 08/11/2015   Idiopathic insomnia 08/11/2015   Avitaminosis D 08/11/2015    No Known Allergies  Immunization History  Administered Date(s) Administered   COVID-19, mRNA, vaccine(Comirnaty)12 years and older 01/04/2023   Fluad Quad(high Dose 65+) 07/13/2020, 06/28/2022   Influenza, High Dose Seasonal PF 07/27/2019, 07/02/2021   Influenza,inj,Quad PF,6+ Mos 07/17/2017, 07/24/2018   Influenza-Unspecified 07/14/2015, 07/27/2019   PFIZER(Purple Top)SARS-COV-2 Vaccination 11/23/2019, 12/19/2019, 08/11/2020, 02/01/2021   Pfizer Covid-19 Vaccine Bivalent Booster 100yrs & up 07/02/2021   Pneumococcal Conjugate-13 04/08/2020  Pneumococcal Polysaccharide-23 04/10/2021   Tdap 08/25/2011   Zoster Recombinant(Shingrix) 07/24/2018, 12/01/2018   Zoster, Live 11/18/2014    Past Surgical History:  Procedure Laterality Date   BREAST BIOPSY Right 2008   CORE - NEG   COLONOSCOPY  08/11/2014   COLONOSCOPY WITH PROPOFOL N/A 07/07/2020   Procedure: COLONOSCOPY WITH PROPOFOL;   Surgeon: Toney Reil, MD;  Location: [facility] ENDOSCOPY;  Service: Gastroenterology;  Laterality: N/A;   LAPAROSCOPY     endometriosis    Social History   Tobacco Use   Smoking status: Never   Smokeless tobacco: Never  Vaping Use   Vaping status: Never Used  Substance Use Topics   Alcohol use: Yes    Alcohol/week: 1.0 standard drink of alcohol    Types: 1 Standard drinks or equivalent per week    Comment: occasional   Drug use: No    Family History  Problem Relation Age of Onset   CAD Father    Aortic aneurysm Father    Asthma Father    Heart failure Mother    Breast cancer Paternal Aunt         05/02/2023    2:05 PM 08/25/2022    2:56 PM 05/07/2022    9:59 AM 04/10/2021    9:26 AM  GAD 7 : Generalized Anxiety Score  Nervous, Anxious, on Edge 0 0 0 0  Control/stop worrying 0 0 0 0  Worry too much - different things 0 0 0 0  Trouble relaxing 0 0 0 0  Restless 0 0 0 0  Easily annoyed or irritable 0 0 0 0  Afraid - awful might happen 0 0 0 0  Total GAD 7 Score 0 0 0 0  Anxiety Difficulty Not difficult at all Not difficult at all Not difficult at all Not difficult at all       05/02/2023    2:05 PM 08/25/2022    2:56 PM 05/07/2022    9:59 AM  Depression screen PHQ 2/9  Decreased Interest 0 0 0  Down, Depressed, Hopeless 0 0 0  PHQ - 2 Score 0 0 0  Altered sleeping 0 1 0  Tired, decreased energy 3 0 0  Change in appetite 0 1 0  Feeling bad or failure about yourself  0 0 0  Trouble concentrating 0 0 0  Moving slowly or fidgety/restless 0 0 0  Suicidal thoughts 0 0 0  PHQ-9 Score 3 2 0  Difficult doing work/chores Not difficult at all Not difficult at all Not difficult at all    BP Readings from Last 3 Encounters:  05/02/23 122/86  05/07/22 118/72  04/10/21 110/72    Wt Readings from Last 3 Encounters:  05/02/23 141 lb (64 kg)  05/07/22 136 lb (61.7 kg)  04/10/21 147 lb (66.7 kg)    BP 122/86   Pulse 85   Temp 97.9 F (36.6 C) (Oral)   Ht 5'  4" (1.626 m)   Wt 141 lb (64 kg)   SpO2 96%   BMI 24.20 kg/m   Physical Exam Vitals and nursing note reviewed.  Constitutional:      General: She is not in acute distress.    Appearance: Normal appearance.  HENT:     Right Ear: Tympanic membrane normal.     Left Ear: Tympanic membrane normal.     Ears:     Comments: EAC clear bilaterally with good view of TM which is without effusion or erythema.  Nose: Nose normal.     Comments: Sinuses nontender    Mouth/Throat:     Mouth: Mucous membranes are moist.     Pharynx: No oropharyngeal exudate or posterior oropharyngeal erythema.  Eyes:     Conjunctiva/sclera: Conjunctivae normal.     Pupils: Pupils are equal, round, and reactive to light.  Cardiovascular:     Rate and Rhythm: Normal rate and regular rhythm.     Heart sounds: No murmur heard.    No friction rub. No gallop.  Pulmonary:     Effort: Pulmonary effort is normal.     Breath sounds: Wheezing and rhonchi present. No rales.  Lymphadenopathy:     Cervical: No cervical adenopathy.     Recent Labs     Component Value Date/Time   NA 139 05/07/2022 1038   K 4.3 05/07/2022 1038   CL 101 05/07/2022 1038   CO2 23 05/07/2022 1038   GLUCOSE 88 05/07/2022 1038   BUN 15 05/07/2022 1038   CREATININE 0.90 05/07/2022 1038   CALCIUM 9.9 05/07/2022 1038   PROT 6.7 05/07/2022 1038   ALBUMIN 4.5 05/07/2022 1038   AST 21 05/07/2022 1038   ALT 13 05/07/2022 1038   ALKPHOS 55 05/07/2022 1038   BILITOT 0.4 05/07/2022 1038   GFRNONAA 73 04/08/2020 1105   GFRAA 84 04/08/2020 1105    Lab Results  Component Value Date   WBC 7.3 05/07/2022   HGB 14.7 05/07/2022   HCT 45.5 05/07/2022   MCV 91 05/07/2022   PLT 291 05/07/2022   No results found for: "HGBA1C" Lab Results  Component Value Date   CHOL 212 (H) 05/07/2022   HDL 61 05/07/2022   LDLCALC 129 (H) 05/07/2022   TRIG 124 05/07/2022   CHOLHDL 3.5 05/07/2022   Lab Results  Component Value Date   TSH 1.110  05/07/2022     Assessment and Plan:  1. Mild intermittent asthma with exacerbation Signs and symptoms most send with asthma exacerbation/acute bronchitis.  Will send Tessalon Perles for symptomatic relief.  Discussed with patient option for conservative management versus prednisone pack, patient elects for prednisone prescription today.  I reminded her that prednisone is not always needed and she might consider waiting until tomorrow morning before initiating this prescription.  - benzonatate (TESSALON) 100 MG capsule; Take 2 capsules (200 mg total) by mouth 3 (three) times daily for 10 days.  Dispense: 30 capsule; Refill: 0 - predniSONE (DELTASONE) 10 MG tablet; Take 6 tablets (60 mg total) by mouth daily with breakfast for 1 day, THEN 5 tablets (50 mg total) daily with breakfast for 1 day, THEN 4 tablets (40 mg total) daily with breakfast for 1 day, THEN 3 tablets (30 mg total) daily with breakfast for 1 day, THEN 2 tablets (20 mg total) daily with breakfast for 1 day, THEN 1 tablet (10 mg total) daily with breakfast for 1 day.  Dispense: 21 tablet; Refill: 0   Return if symptoms worsen or fail to improve.    Alvester Morin, PA-C, DMSc, Nutritionist [facility] Primary Care and Sports Medicine [facility] [phone]

## 2023-05-02 NOTE — Telephone Encounter (Signed)
Summary: congestion / rx req   The patient's husband has called to request medication to help with the patient's congestion  The patient has experienced congestion for roughly a week  The patient previously had a cold and frequently uses an inhaler  The patient's husband would like to be contacted further when possible  Please contact when available       Chief Complaint: worsening cough, SOB with exertion Symptoms: cold sx since last week. Cough congestion beige sputum. Has been taking mucinex . Uses inhaler more frequent than normal. SOB with exertion , "winded" with mild exertion Frequency: 1 week  Pertinent Negatives: Patient denies chest pain no difficulty breathing  Disposition: [] ED /[] Urgent Care (no appt availability in office) / [x] Appointment(In office/virtual)/ []  [place] [facility]/ [] Home Care/ [] Refused Recommended Disposition /[] [place] [facility]/ []  Follow-up with PCP Additional Notes:   No available appt with PCP until 05/06/23. Acute OV scheduled with another provider in office for today .  Patient send my chart message      Reason for Disposition  [1] MILD difficulty breathing (e.g., minimal/no SOB at rest, SOB with walking, pulse <100) AND [2] still present when not coughing  Answer Assessment - Initial Assessment Questions 1. ONSET: "When did the cough begin?"      Last Tuesday  2. SEVERITY: "How bad is the cough today?"      Coughing at times causes to get "winded" 3. SPUTUM: "Describe the color of your sputum" (none, dry cough; clear, white, yellow, green)     Beige  4. HEMOPTYSIS: "Are you coughing up any blood?" If so ask: "How much?" (flecks, streaks, tablespoons, etc.)     Na  5. DIFFICULTY BREATHING: "Are you having difficulty breathing?" If Yes, ask: "How bad is it?" (e.g., mild, moderate, severe)    - MILD: No SOB at rest, mild SOB with walking, speaks normally in sentences, can lie down, no retractions, pulse < 100.    - MODERATE: SOB  at rest, SOB with minimal exertion and prefers to sit, cannot lie down flat, speaks in phrases, mild retractions, audible wheezing, pulse 100-120.    - SEVERE: Very SOB at rest, speaks in single words, struggling to breathe, sitting hunched forward, retractions, pulse > 120      Shortness of breath with exertion causes to get "winded" with minor tasks.  6. FEVER: "Do you have a fever?" If Yes, ask: "What is your temperature, how was it measured, and when did it start?"     na 7. CARDIAC HISTORY: "Do you have any history of heart disease?" (e.g., heart attack, congestive heart failure)      na 8. LUNG HISTORY: "Do you have any history of lung disease?"  (e.g., pulmonary embolus, asthma, emphysema)     See hx  9. PE RISK FACTORS: "Do you have a history of blood clots?" (or: recent major surgery, recent prolonged travel, bedridden)     na 10. OTHER SYMPTOMS: "Do you have any other symptoms?" (e.g., runny nose, wheezing, chest pain)       Cold sx last week. Cough congestion beige sputum. Using inhaler more often than she should.  11. PREGNANCY: "Is there any chance you are pregnant?" "When was your last menstrual period?"       na 12. TRAVEL: "Have you traveled out of the country in the last month?" (e.g., travel history, exposures)       na  Protocols used: Cough - Acute Productive-A-AH

## 2023-05-04 ENCOUNTER — Other Ambulatory Visit: Payer: Self-pay | Admitting: Internal Medicine

## 2023-05-05 NOTE — Telephone Encounter (Signed)
Requested Prescriptions  Pending Prescriptions Disp Refills   traZODone (DESYREL) 50 MG tablet [Pharmacy Med Name: TRAZODONE 50 MG TABLET] 90 tablet 0    Sig: TAKE 1 TABLET BY MOUTH EVERYDAY AT BEDTIME     Psychiatry: Antidepressants - Serotonin Modulator Passed - 05/04/2023  7:12 PM      Passed - Completed PHQ-2 or PHQ-9 in the last 360 days      Passed - Valid encounter within last 6 months    Recent Outpatient Visits           3 days ago Mild intermittent asthma with exacerbation   [facility] at [facility], Melton Alar, PA   8 months ago Acute cough   [facility] at [facility], Nyoka Cowden, MD   12 months ago Annual physical exam   [facility] at [facility], Nyoka Cowden, MD   2 years ago Annual physical exam   [facility] Medicine at [facility], Nyoka Cowden, MD   2 years ago Chronic recurrent major depressive disorder [facility])   [place] [facility] Care & Sports Medicine at [facility], Nyoka Cowden, MD       Future Appointments             In 2 weeks Judithann Graves, Nyoka Cowden, MD [facility] Primary Care & Sports Medicine at [facility], [facility]

## 2023-05-09 DIAGNOSIS — H40003 Preglaucoma, unspecified, bilateral: Secondary | ICD-10-CM | POA: Diagnosis not present

## 2023-05-09 DIAGNOSIS — H43813 Vitreous degeneration, bilateral: Secondary | ICD-10-CM | POA: Diagnosis not present

## 2023-05-09 DIAGNOSIS — H2513 Age-related nuclear cataract, bilateral: Secondary | ICD-10-CM | POA: Diagnosis not present

## 2023-05-23 ENCOUNTER — Ambulatory Visit (INDEPENDENT_AMBULATORY_CARE_PROVIDER_SITE_OTHER): Payer: Medicare PPO | Admitting: Internal Medicine

## 2023-05-23 ENCOUNTER — Encounter: Payer: Self-pay | Admitting: Internal Medicine

## 2023-05-23 VITALS — BP 128/68 | HR 73 | Ht 64.0 in | Wt 144.4 lb

## 2023-05-23 DIAGNOSIS — J4520 Mild intermittent asthma, uncomplicated: Secondary | ICD-10-CM

## 2023-05-23 DIAGNOSIS — Z23 Encounter for immunization: Secondary | ICD-10-CM | POA: Diagnosis not present

## 2023-05-23 DIAGNOSIS — Z1231 Encounter for screening mammogram for malignant neoplasm of breast: Secondary | ICD-10-CM

## 2023-05-23 DIAGNOSIS — F324 Major depressive disorder, single episode, in partial remission: Secondary | ICD-10-CM | POA: Diagnosis not present

## 2023-05-23 DIAGNOSIS — E785 Hyperlipidemia, unspecified: Secondary | ICD-10-CM

## 2023-05-23 DIAGNOSIS — Z0000 Encounter for general adult medical examination without abnormal findings: Secondary | ICD-10-CM

## 2023-05-23 DIAGNOSIS — M810 Age-related osteoporosis without current pathological fracture: Secondary | ICD-10-CM

## 2023-05-23 MED ORDER — FLUTICASONE-SALMETEROL 100-50 MCG/ACT IN AEPB: 1.0000 | INHALATION_SPRAY | Freq: Two times a day (BID) | RESPIRATORY_TRACT | 0 refills | Status: DC

## 2023-05-23 NOTE — Assessment & Plan Note (Addendum)
Asthma has been flared recently since an episode of bronchitis Using albuterol as needed but no controlled medication. Will treat with Advair for one month - longer if needed Recommend Prevnar 20 and RSV vaccines this fall.

## 2023-05-23 NOTE — Assessment & Plan Note (Signed)
Lipids managed with diet and exercise Lab Results  Component Value Date   LDLCALC 129 (H) 05/07/2022

## 2023-05-23 NOTE — Assessment & Plan Note (Signed)
Stable osteopenia last year. On calcium and vitamin D

## 2023-05-23 NOTE — Assessment & Plan Note (Signed)
Clinically stable on current regimen with good control of symptoms, No SI or HI. No change in management at this time. Prozac 40 mg and Trazodone

## 2023-05-23 NOTE — Progress Notes (Signed)
Date:  05/23/2023   Name:  Janet Cook   DOB:  1956-09-09   MRN:  [id number]   Chief Complaint: Annual Exam Janet Cook is a 67 y.o. female who presents today for her Complete Annual Exam. She feels well. She reports exercising - not at this time. She reports she is sleeping fairly well. Breast complaints - none.  Mammogram: scheduled DEXA: 06/2022 stable osteopenia Pap smear: discontinued Colonoscopy: 06/2020 repeat 7 yrs  Health Maintenance Due  Topic Date Due   DTaP/Tdap/Td (2 - Td or Tdap) 08/24/2021   COVID-19 Vaccine (7 - 2023-24 season) 03/01/2023   INFLUENZA VACCINE  05/12/2023   Medicare Annual Wellness (AWV)  07/17/2023    Immunization History  Administered Date(s) Administered   COVID-19, mRNA, vaccine(Comirnaty)12 years and older 01/04/2023   Fluad Quad(high Dose 65+) 07/13/2020, 06/28/2022   Influenza, High Dose Seasonal PF 07/27/2019, 07/02/2021   Influenza,inj,Quad PF,6+ Mos 07/17/2017, 07/24/2018   Influenza-Unspecified 07/14/2015, 07/27/2019   PFIZER(Purple Top)SARS-COV-2 Vaccination 11/23/2019, 12/19/2019, 08/11/2020, 02/01/2021   Pfizer Covid-19 Vaccine Bivalent Booster 36yrs & up 07/02/2021   Pneumococcal Conjugate-13 04/08/2020   Pneumococcal Polysaccharide-23 04/10/2021   Tdap 08/25/2011   Zoster Recombinant(Shingrix) 07/24/2018, 12/01/2018   Zoster, Live 11/18/2014    Depression        This is a chronic problem.The problem is unchanged.  Associated symptoms include no fatigue and no headaches.  Past treatments include SSRIs - Selective serotonin reuptake inhibitors and other medications.  Compliance with treatment is good.  Previous treatment provided significant relief. Asthma She complains of chest tightness and cough. There is no shortness of breath or wheezing. This is a new problem. The current episode started 1 to 4 weeks ago. The problem occurs daily. The problem has been unchanged. The cough is non-productive. Pertinent negatives include  no chest pain, fever, headaches or trouble swallowing. Her symptoms are alleviated by beta-agonist. Her past medical history is significant for asthma.    Lab Results  Component Value Date   NA 139 05/07/2022   K 4.3 05/07/2022   CO2 23 05/07/2022   GLUCOSE 88 05/07/2022   BUN 15 05/07/2022   CREATININE 0.90 05/07/2022   CALCIUM 9.9 05/07/2022   EGFR 70 05/07/2022   GFRNONAA 73 04/08/2020   Lab Results  Component Value Date   CHOL 212 (H) 05/07/2022   HDL 61 05/07/2022   LDLCALC 129 (H) 05/07/2022   TRIG 124 05/07/2022   CHOLHDL 3.5 05/07/2022   Lab Results  Component Value Date   TSH 1.110 05/07/2022   No results found for: "HGBA1C" Lab Results  Component Value Date   WBC 7.3 05/07/2022   HGB 14.7 05/07/2022   HCT 45.5 05/07/2022   MCV 91 05/07/2022   PLT 291 05/07/2022   Lab Results  Component Value Date   ALT 13 05/07/2022   AST 21 05/07/2022   ALKPHOS 55 05/07/2022   BILITOT 0.4 05/07/2022   Lab Results  Component Value Date   VD25OH 44.2 04/10/2021     Review of Systems  Constitutional:  Negative for chills, fatigue and fever.  HENT:  Negative for congestion, hearing loss, tinnitus, trouble swallowing and voice change.   Eyes:  Negative for visual disturbance.  Respiratory:  Positive for cough and chest tightness. Negative for shortness of breath and wheezing.   Cardiovascular:  Negative for chest pain, palpitations and leg swelling.  Gastrointestinal:  Negative for abdominal pain, constipation, diarrhea and vomiting.  Endocrine: Negative for polydipsia and polyuria.  Genitourinary:  Negative for dysuria, frequency, genital sores, vaginal bleeding and vaginal discharge.  Musculoskeletal:  Negative for arthralgias, gait problem and joint swelling.  Skin:  Negative for color change and rash.  Neurological:  Negative for dizziness, tremors, light-headedness and headaches.  Hematological:  Negative for adenopathy. Does not bruise/bleed easily.   Psychiatric/Behavioral:  Positive for depression. Negative for dysphoric mood and sleep disturbance. The patient is not nervous/anxious.     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) 08/11/2015   OP (osteoporosis) 08/11/2015   Idiopathic insomnia 08/11/2015   Avitaminosis D 08/11/2015    No Known Allergies  Past Surgical History:  Procedure Laterality Date   BREAST BIOPSY Right 2008   CORE - NEG   COLONOSCOPY  08/11/2014   COLONOSCOPY WITH PROPOFOL N/A 07/07/2020   Procedure: COLONOSCOPY WITH PROPOFOL;  Surgeon: Toney Reil, MD;  Location: [facility] ENDOSCOPY;  Service: Gastroenterology;  Laterality: N/A;   LAPAROSCOPY     endometriosis    Social History   Tobacco Use   Smoking status: Never   Smokeless tobacco: Never  Vaping Use   Vaping status: Never Used  Substance Use Topics   Alcohol use: Yes    Alcohol/week: 1.0 standard drink of alcohol    Types: 1 Standard drinks or equivalent per week    Comment: occasional   Drug use: No     Medication list has been reviewed and updated.  Current Meds  Medication Sig   albuterol (VENTOLIN HFA) 108 (90 Base) MCG/ACT inhaler INHALE TWO PUFFS FOUR TIMES DAILY AS NEEDED   Calcium Carbonate-Vit D-Min (CALTRATE 600+D PLUS PO) Take by mouth.   fexofenadine (ALLEGRA) 60 MG tablet Take 60 mg by mouth 2 (two) times daily.   FLUoxetine (PROZAC) 40 MG capsule TAKE 1 CAPSULE BY MOUTH EVERY DAY   fluticasone-salmeterol (ADVAIR) 100-50 MCG/ACT AEPB Inhale 1 puff into the lungs 2 (two) times daily.   traZODone (DESYREL) 50 MG tablet TAKE 1 TABLET BY MOUTH EVERYDAY AT BEDTIME   valACYclovir (VALTREX) 1000 MG tablet TAKE 2 TABLETS (2,000 MG  TOTAL) BY MOUTH TWICE A DAY (Patient taking differently: Take 1,000 mg by mouth daily as needed.)       05/23/2023    9:21 AM 05/02/2023    2:05 PM 08/25/2022    2:56 PM 05/07/2022    9:59 AM  GAD 7 : Generalized Anxiety Score  Nervous, Anxious, on Edge 0 0 0 0  Control/stop worrying 0 0 0 0  Worry too much - different things 0 0 0 0  Trouble relaxing 0 0 0 0  Restless 0 0 0 0  Easily annoyed or irritable 0 0 0 0  Afraid - awful might happen 0 0 0 0  Total GAD 7 Score 0 0 0 0  Anxiety Difficulty Not difficult at all Not difficult at all Not difficult at all Not difficult at all       05/23/2023    9:21 AM 05/02/2023    2:05 PM 08/25/2022    2:56 PM  Depression screen PHQ 2/9  Decreased Interest 0 0 0  Down, Depressed, Hopeless 0 0 0  PHQ - 2 Score 0 0 0  Altered  sleeping 0 0 1  Tired, decreased energy 0 3 0  Change in appetite 0 0 1  Feeling bad or failure about yourself  0 0 0  Trouble concentrating 0 0 0  Moving slowly or fidgety/restless 0 0 0  Suicidal thoughts 0 0 0  PHQ-9 Score 0 3 2  Difficult doing work/chores Not difficult at all Not difficult at all Not difficult at all    BP Readings from Last 3 Encounters:  05/23/23 128/68  05/02/23 122/86  05/07/22 118/72    Physical Exam Vitals and nursing note reviewed.  Constitutional:      General: She is not in acute distress.    Appearance: She is well-developed.  HENT:     Head: Normocephalic and atraumatic.     Right Ear: Tympanic membrane and ear canal normal.     Left Ear: Tympanic membrane and ear canal normal.     Nose:     Right Sinus: No maxillary sinus tenderness.     Left Sinus: No maxillary sinus tenderness.  Eyes:     General: No scleral icterus.       Right eye: No discharge.        Left eye: No discharge.     Conjunctiva/sclera: Conjunctivae normal.  Neck:     Thyroid: No thyromegaly.     Vascular: No carotid bruit.  Cardiovascular:     Rate and Rhythm: Normal rate and regular rhythm.      Pulses: Normal pulses.     Heart sounds: Normal heart sounds.  Pulmonary:     Effort: Pulmonary effort is normal. No respiratory distress.     Breath sounds: Normal breath sounds. No wheezing or rhonchi.     Comments: Tight dry cough occasionally during exam Chest:  Breasts:    Right: No mass, nipple discharge, skin change or tenderness.     Left: No mass, nipple discharge, skin change or tenderness.  Abdominal:     General: Bowel sounds are normal.     Palpations: Abdomen is soft.     Tenderness: There is no abdominal tenderness.  Musculoskeletal:     Cervical back: Normal range of motion. No erythema.     Right lower leg: No edema.     Left lower leg: No edema.  Lymphadenopathy:     Cervical: No cervical adenopathy.  Skin:    General: Skin is warm and dry.     Findings: No rash.  Neurological:     Mental Status: She is alert and oriented to person, place, and time.     Cranial Nerves: No cranial nerve deficit.     Sensory: No sensory deficit.     Deep Tendon Reflexes: Reflexes are normal and symmetric.  Psychiatric:        Attention and Perception: Attention normal.        Mood and Affect: Mood normal.     Wt Readings from Last 3 Encounters:  05/23/23 144 lb 6.4 oz (65.5 kg)  05/02/23 141 lb (64 kg)  05/07/22 136 lb (61.7 kg)    BP 128/68   Pulse 73   Ht 5\' 4"  (1.626 m)   Wt 144 lb 6.4 oz (65.5 kg)   SpO2 97%   BMI 24.79 kg/m   Assessment and Plan:  Problem List Items Addressed This Visit       Unprioritized   OP (osteoporosis) (Chronic)    Stable osteopenia last year. 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 No change in management at this time. Prozac 40 mg and Trazodone      Relevant Orders   TSH   Asthma, mild intermittent (Chronic)    Asthma has been flared recently since an episode of bronchitis Using albuterol as needed but no controlled medication. Will treat with Advair for one month - longer if needed Recommend Prevnar 20 and RSV vaccines this fall.       Relevant Medications   fluticasone-salmeterol (ADVAIR) 100-50 MCG/ACT AEPB   Other Relevant Orders   CBC with Differential/Platelet   Other Visit Diagnoses     Annual physical exam    -  Primary   continue healthy diet and exercise   Encounter for screening mammogram for breast cancer       mammogram scheduled for later this month   Need for vaccination for pneumococcus           Return in about 1 year (around 05/22/2024) for CPX.    Reubin Milan, MD [facility] [place]

## 2023-06-02 ENCOUNTER — Ambulatory Visit
Admission: RE | Admit: 2023-06-02 | Discharge: 2023-06-02 | Disposition: A | Discharge status: Home / Self Care | Payer: Medicare PPO | Source: Ambulatory Visit | Attending: Internal Medicine | Admitting: Internal Medicine

## 2023-06-02 DIAGNOSIS — Z1231 Encounter for screening mammogram for malignant neoplasm of breast: Secondary | ICD-10-CM | POA: Diagnosis not present

## 2023-06-19 ENCOUNTER — Other Ambulatory Visit: Payer: Self-pay | Admitting: Internal Medicine

## 2023-06-19 DIAGNOSIS — J4520 Mild intermittent asthma, uncomplicated: Secondary | ICD-10-CM

## 2023-07-16 ENCOUNTER — Other Ambulatory Visit: Payer: Self-pay | Admitting: Internal Medicine

## 2023-07-16 DIAGNOSIS — F324 Major depressive disorder, single episode, in partial remission: Secondary | ICD-10-CM

## 2023-07-18 NOTE — Telephone Encounter (Signed)
Continue per last OV Requested Prescriptions  Pending Prescriptions Disp Refills   FLUoxetine (PROZAC) 40 MG capsule [Pharmacy Med Name: FLUOXETINE HCL 40 MG CAPSULE] 90 capsule 3    Sig: TAKE 1 CAPSULE BY MOUTH EVERY DAY     Psychiatry:  Antidepressants - SSRI Passed - 07/16/2023  8:38 AM      Passed - Completed PHQ-2 or PHQ-9 in the last 360 days      Passed - Valid encounter within last 6 months    Recent Outpatient Visits           1 month ago Annual physical exam   [facility] at [facility], Nyoka Cowden, MD   2 months ago Mild intermittent asthma with exacerbation   [facility] at [facility], Melton Alar, [place]   10 months ago Acute cough   [facility] at [facility], Nyoka Cowden, MD   1 year ago Annual physical exam   [facility] at [facility], Nyoka Cowden, MD   2 years ago Annual physical exam   [facility] at [facility], Nyoka Cowden, MD       Future Appointments             In 10 months Judithann Graves Nyoka Cowden, MD [facility] at [facility], [facility]

## 2023-07-19 ENCOUNTER — Ambulatory Visit: Payer: Medicare PPO

## 2023-07-19 DIAGNOSIS — Z0000 Encounter for general adult medical examination without abnormal findings: Secondary | ICD-10-CM | POA: Diagnosis not present

## 2023-07-19 NOTE — Patient Instructions (Addendum)
Janet Cook , Thank you for taking time to come for your Medicare Wellness Visit. I appreciate your ongoing commitment to your health goals. Please review the following plan we discussed and let me know if I can assist you in the future.   Referrals/Orders/Follow-Ups/Clinician Recommendations: none  This is a list of the screening recommended for you and due dates:  Health Maintenance  Topic Date Due   DTaP/Tdap/Td vaccine (2 - Td or Tdap) 08/24/2021   Flu Shot  05/12/2023   COVID-19 Vaccine (7 - 2023-24 season) 06/12/2023   Mammogram  06/01/2024   DEXA scan (bone density measurement)  06/29/2024   Medicare Annual Wellness Visit  07/18/2024   Colon Cancer Screening  07/08/2027   Pneumonia Vaccine  Completed   Zoster (Shingles) Vaccine  Completed   Hepatitis C Screening  Addressed   HPV Vaccine  Aged Out    Advanced directives: (ACP Link)Information on Advanced Care Planning can be found at [facility] [place] Advance Health Care Directives Advance Health Care Directives ([email or web address]   Next Medicare Annual Wellness Visit scheduled for next year: Yes   07/24/24 @ 10:15 am by video

## 2023-07-19 NOTE — Progress Notes (Signed)
Subjective:   Janet Cook is a 67 y.o. female who presents for Medicare Annual (Subsequent) preventive examination.  Visit Complete: Virtual I connected with  Anise Salvo on 07/19/23 by a audio enabled telemedicine application and verified that I am speaking with the correct person using two identifiers.  Patient Location: Home  Provider Location: Office/Clinic  I discussed the limitations of evaluation and management by telemedicine. The patient expressed understanding and agreed to proceed.  Vital Signs: Because this visit was a virtual/telehealth visit, some criteria may be missing or patient reported. Any vitals not documented were not able to be obtained and vitals that have been documented are patient reported. Cardiac Risk Factors include: advanced age (>32men, >26 women);dyslipidemia     Objective:    Today's Vitals   07/19/23 1305  PainSc: 0-No pain   There is no height or weight on file to calculate BMI.     07/19/2023    1:09 PM 07/08/2021    8:07 AM 07/07/2020    7:47 AM 06/11/2020    2:12 PM 01/07/2016    8:28 AM  Advanced Directives  Does Patient Have a Medical Advance Directive? No Yes Yes Yes No  Type of Special educational needs teacher of [place];Living will  Healthcare Power of [place];Living will   Copy of Healthcare Power of Attorney in Chart?  No - copy requested  No - copy requested   Would patient like information on creating a medical advance directive? No - Patient declined    No - patient declined information    Current Medications (verified) Outpatient Encounter Medications as of 07/19/2023  Medication Sig   albuterol (VENTOLIN HFA) 108 (90 Base) MCG/ACT inhaler INHALE TWO PUFFS FOUR TIMES DAILY AS NEEDED   Calcium Carbonate-Vit D-Min (CALTRATE 600+D PLUS PO) Take by mouth.   fexofenadine (ALLEGRA) 60 MG tablet Take 60 mg by mouth 2 (two) times daily.   FLUoxetine (PROZAC) 40 MG capsule TAKE 1 CAPSULE BY MOUTH EVERY DAY    fluticasone-salmeterol (WIXELA INHUB) 100-50 MCG/ACT AEPB INHALE 1 PUFF INTO THE LUNGS TWICE A DAY   traZODone (DESYREL) 50 MG tablet TAKE 1 TABLET BY MOUTH EVERYDAY AT BEDTIME   valACYclovir (VALTREX) 1000 MG tablet TAKE 2 TABLETS (2,000 MG TOTAL) BY MOUTH TWICE A DAY (Patient taking differently: Take 1,000 mg by mouth daily as needed.)   No facility-administered encounter medications on file as of 07/19/2023.    Allergies (verified) Patient has no known allergies.   History: Past Medical History:  Diagnosis Date   Anxiety 05/11/1982   Asthma 09/20/1983   Basal cell carcinoma 05/29/2021   back   Depression 05/11/1982   Mild hyperlipidemia    OP (osteoporosis)    SCCA (squamous cell carcinoma) of skin 03/30/2019   Past Surgical History:  Procedure Laterality Date   BREAST BIOPSY Right 2008   CORE - NEG   COLONOSCOPY  08/11/2014   COLONOSCOPY WITH PROPOFOL N/A 07/07/2020   Procedure: COLONOSCOPY WITH PROPOFOL;  Surgeon: Toney Reil, MD;  Location: [facility] ENDOSCOPY;  Service: Gastroenterology;  Laterality: N/A;   LAPAROSCOPY     endometriosis   Family History  Problem Relation Age of Onset   CAD Father    Aortic aneurysm Father    Asthma Father    Heart failure Mother    Breast cancer Paternal Aunt    Social History   Socioeconomic History   Marital status: Married    Spouse name: Kenitha Glendinning   Number of children:  0   Years of education: Not on file   Highest education level: Not on file  Occupational History   Occupation: retired  Tobacco Use   Smoking status: Never   Smokeless tobacco: Never  Vaping Use   Vaping status: Never Used  Substance and Sexual Activity   Alcohol use: Yes    Alcohol/week: 1.0 standard drink of alcohol    Types: 1 Standard drinks or equivalent per week    Comment: occasional   Drug use: No   Sexual activity: Yes    Birth control/protection: Post-menopausal, None  Other Topics Concern   Not on file  Social History Narrative    2nd marriage; 1st husband passed away 08-03-19 parkinson's and dementia   Social Determinants of Health   Financial Resource Strain: Low Risk  (07/19/2023)   Overall Financial Resource Strain (CARDIA)    Difficulty of Paying Living Expenses: Not hard at all  Food Insecurity: No Food Insecurity (07/19/2023)   Hunger Vital Sign    Worried About Running Out of Food in the Last Year: Never true    Ran Out of Food in the Last Year: Never true  Transportation Needs: No Transportation Needs (07/19/2023)   PRAPARE - Administrator, Civil Service (Medical): No    Lack of Transportation (Non-Medical): No  Physical Activity: Sufficiently Active (07/19/2023)   Exercise Vital Sign    Days of Exercise per Week: 3 days    Minutes of Exercise per Session: 50 min  Stress: No Stress Concern Present (07/19/2023)   [employer] of Occupational Health - Occupational Stress Questionnaire    Feeling of Stress : Not at all  Social Connections: Moderately Isolated (07/19/2023)   Social Connection and Isolation Panel [NHANES]    Frequency of Communication with Friends and Family: Twice a week    Frequency of Social Gatherings with Friends and Family: Once a week    Attends Religious Services: Never    Database administrator or Organizations: No    Attends Engineer, structural: Never    Marital Status: Married    Tobacco Counseling Counseling given: Not Answered   Clinical Intake:  Pre-visit preparation completed: Yes  Pain : No/denies pain Pain Score: 0-No pain     Nutritional Status: BMI of 19-24  Normal Nutritional Risks: None Diabetes: No  How often do you need to have someone help you when you read instructions, pamphlets, or other written materials from your doctor or pharmacy?: 1 - Never  Interpreter Needed?: No  Information entered by :: Kennedy Bucker, LPN   Activities of Daily Living    07/19/2023    1:10 PM  In your present state of health, do you have any  difficulty performing the following activities:  Hearing? 0  Vision? 0  Difficulty concentrating or making decisions? 0  Walking or climbing stairs? 0  Dressing or bathing? 0  Doing errands, shopping? 0  Preparing Food and eating ? N  Using the Toilet? N  In the past six months, have you accidently leaked urine? N  Do you have problems with loss of bowel control? N  Managing your Medications? N  Managing your Finances? N  Housekeeping or managing your Housekeeping? N    Patient Care Team: Reubin Milan, MD as PCP - General (Internal Medicine) Dermatology, [place] Vanga, Loel Dubonnet, MD as Consulting Physician (Gastroenterology)  Indicate any recent Medical Services you may have received from other than [facility] providers in the past year (date  may be approximate).     Assessment:   This is a routine wellness examination for Cartha.  Hearing/Vision screen Hearing Screening - Comments:: No aids Vision Screening - Comments:: Wears glasses- Dr.Porfilio   Goals Addressed             This Visit's Progress    DIET - EAT MORE FRUITS AND VEGETABLES         Depression Screen    07/19/2023    1:08 PM 05/23/2023    9:21 AM 05/02/2023    2:05 PM 08/25/2022    2:56 PM 05/07/2022    9:59 AM 07/08/2021    8:06 AM 04/10/2021    9:26 AM  PHQ 2/9 Scores  PHQ - 2 Score 0 0 0 0 0 0 0  PHQ- 9 Score 0 0 3 2 0  1    Fall Risk    07/19/2023    1:10 PM 05/23/2023    9:21 AM 05/02/2023    2:05 PM 08/25/2022    2:57 PM 07/16/2022    8:13 AM  Fall Risk   Falls in the past year? 0 0 0 0 0  Number falls in past yr: 0 0 0 0 0  Injury with Fall? 0 0 0 0 0  Risk for fall due to : No Fall Risks No Fall Risks No Fall Risks No Fall Risks No Fall Risks  Follow up Falls prevention discussed;Falls evaluation completed Falls evaluation completed Falls evaluation completed Falls evaluation completed Falls evaluation completed    MEDICARE RISK AT HOME: Medicare Risk at Home Any stairs in or  around the home?: Yes If so, are there any without handrails?: No Home free of loose throw rugs in walkways, pet beds, electrical cords, etc?: Yes Adequate lighting in your home to reduce risk of falls?: Yes Life alert?: No Use of a cane, walker or w/c?: No Grab bars in the bathroom?: Yes Shower chair or bench in shower?: Yes Elevated toilet seat or a handicapped toilet?: Yes  TIMED UP AND GO:  Was the test performed?  No    Cognitive Function:        07/19/2023    1:11 PM 07/16/2022    8:14 AM  6CIT Screen  What Year? 0 points 0 points  What month? 0 points 0 points  What time? 0 points 0 points  Count back from 20 0 points 0 points  Months in reverse 0 points 0 points  Repeat phrase 0 points 4 points  Total Score 0 points 4 points    Immunizations Immunization History  Administered Date(s) Administered   Fluad Quad(high Dose 65+) 07/13/2020, 06/28/2022   Influenza, High Dose Seasonal PF 07/27/2019, 07/02/2021   Influenza,inj,Quad PF,6+ Mos 07/17/2017, 07/24/2018   Influenza-Unspecified 07/14/2015, 07/27/2019   PFIZER(Purple Top)SARS-COV-2 Vaccination 11/23/2019, 12/19/2019, 08/11/2020, 02/01/2021   Pfizer Covid-19 Vaccine Bivalent Booster 53yrs & up 07/02/2021   Pfizer(Comirnaty)Fall Seasonal Vaccine 12 years and older 01/04/2023   Pneumococcal Conjugate-13 04/08/2020   Pneumococcal Polysaccharide-23 04/10/2021   Tdap 08/25/2011   Zoster Recombinant(Shingrix) 07/24/2018, 12/01/2018   Zoster, Live 11/18/2014    TDAP status: Due, Education has been provided regarding the importance of this vaccine. Advised may receive this vaccine at local pharmacy or Health Dept. Aware to provide a copy of the vaccination record if obtained from local pharmacy or Health Dept. Verbalized acceptance and understanding.  Flu Vaccine status: Due, Education has been provided regarding the importance of this vaccine. Advised may receive this vaccine at local  pharmacy or Health Dept. Aware to  provide a copy of the vaccination record if obtained from local pharmacy or Health Dept. Verbalized acceptance and understanding.  Pneumococcal vaccine status: Up to date  Covid-19 vaccine status: Completed vaccines  Qualifies for Shingles Vaccine? Yes   Zostavax completed Yes   Shingrix Completed?: Yes  Screening Tests Health Maintenance  Topic Date Due   DTaP/Tdap/Td (2 - Td or Tdap) 08/24/2021   INFLUENZA VACCINE  05/12/2023   COVID-19 Vaccine (7 - 2023-24 season) 06/12/2023   MAMMOGRAM  06/01/2024   DEXA SCAN  06/29/2024   Medicare Annual Wellness (AWV)  07/18/2024   Colonoscopy  07/08/2027   Pneumonia Vaccine 47+ Years old  Completed   Zoster Vaccines- Shingrix  Completed   Hepatitis C Screening  Addressed   HPV VACCINES  Aged Out    Health Maintenance  Health Maintenance Due  Topic Date Due   DTaP/Tdap/Td (2 - Td or Tdap) 08/24/2021   INFLUENZA VACCINE  05/12/2023   COVID-19 Vaccine (7 - 2023-24 season) 06/12/2023    Colorectal cancer screening: Type of screening: Colonoscopy. Completed 07/07/20. Repeat every 7 years  Mammogram status: Completed 06/02/23. Repeat every year  Bone Density status: Completed 06/29/22. Results reflect: Bone density results: OSTEOPENIA. Repeat every 5 years.  Lung Cancer Screening: (Low Dose CT Chest recommended if Age 2-80 years, 20 pack-year currently smoking OR have quit w/in 15years.) does not qualify.    Additional Screening:  Hepatitis C Screening: does qualify; Completed no  Vision Screening: Recommended annual ophthalmology exams for early detection of glaucoma and other disorders of the eye. Is the patient up to date with their annual eye exam?  Yes  Who is the provider or what is the name of the office in which the patient attends annual eye exams? Dr.Porfilio If pt is not established with a provider, would they like to be referred to a provider to establish care? No .   Dental Screening: Recommended annual dental exams  for proper oral hygiene   Community Resource Referral / Chronic Care Management: CRR required this visit?  No   CCM required this visit?  No     Plan:     I have personally reviewed and noted the following in the patient's chart:   Medical and social history Use of alcohol, tobacco or illicit drugs  Current medications and supplements including opioid prescriptions. Patient is not currently taking opioid prescriptions. Functional ability and status Nutritional status Physical activity Advanced directives List of other physicians Hospitalizations, surgeries, and ER visits in previous 12 months Vitals Screenings to include cognitive, depression, and falls Referrals and appointments  In addition, I have reviewed and discussed with patient certain preventive protocols, quality metrics, and best practice recommendations. A written personalized care plan for preventive services as well as general preventive health recommendations were provided to patient.     Hal Hope, LPN   16/10/958   After Visit Summary: (MyChart) Due to this being a telephonic visit, the after visit summary with patients personalized plan was offered to patient via MyChart   Nurse Notes: none

## 2023-08-04 ENCOUNTER — Other Ambulatory Visit: Payer: Self-pay | Admitting: Internal Medicine

## 2023-11-30 ENCOUNTER — Encounter: Payer: Self-pay | Admitting: Internal Medicine

## 2023-12-08 ENCOUNTER — Other Ambulatory Visit: Payer: Self-pay | Admitting: Internal Medicine

## 2023-12-08 DIAGNOSIS — R051 Acute cough: Secondary | ICD-10-CM

## 2023-12-08 DIAGNOSIS — F324 Major depressive disorder, single episode, in partial remission: Secondary | ICD-10-CM

## 2023-12-08 DIAGNOSIS — J4520 Mild intermittent asthma, uncomplicated: Secondary | ICD-10-CM

## 2023-12-08 NOTE — Telephone Encounter (Signed)
 Copied from CRM [id number]. Topic: Clinical - Medication Refill >> Dec 08, 2023  1:04 PM Shelah Lewandowsky wrote: Most Recent Primary Care Visit:  Provider: Hal Hope  Department: [facility] [place]  Visit Type: MEDICARE AWV, SEQUENTIAL  Date: 07/19/2023  Medication: albuterol (VENTOLIN HFA) 108 (90 Base) MCG/ACT inhaler FLUoxetine (PROZAC) 40 MG capsule traZODone (DESYREL) 50 MG tablet fluticasone-salmeterol (WIXELA INHUB) 100-50 MCG/ACT AEPB   Has the patient contacted their pharmacy? Yes (Agent: If no, request that the patient contact the pharmacy for the refill. If patient does not wish to contact the pharmacy document the reason why and proceed with request.) (Agent: If yes, when and what did the pharmacy advise?)  Is this the correct pharmacy for this prescription? Yes If no, delete pharmacy and type the correct one.  This is the patient's preferred pharmacy:  [facility]  Has the prescription been filled recently? Yes  Is the patient out of the medication? Yes  Has the patient been seen for an appointment in the last year OR does the patient have an upcoming appointment? Yes  Can we respond through MyChart? Yes  Agent: Please be advised that Rx refills may take up to 3 business days. We ask that you follow-up with your pharmacy.

## 2023-12-09 NOTE — Telephone Encounter (Signed)
 Requested medication (s) are due for refill today:   Albuterol and Wixela - Yes;   Prozac and Trazodone - No  Requested medication (s) are on the active medication list:   Yes for all 4  Future visit scheduled:   Yes 8/14   Last ordered: Albuterol 01/13/2023 18 each, 1 refill;   Prozac 07/18/2023 #90, 3 refills;  Wixela 06/20/2023 60 each, 5 refills;  Trazodone 08/04/2023 #90, 3 refills  Returned because the [facility] rx needs dispensing, amount and refills filled in.      Requested Prescriptions  Pending Prescriptions Disp Refills   albuterol (VENTOLIN HFA) 108 (90 Base) MCG/ACT inhaler 18 each 1    Sig: INHALE TWO PUFFS FOUR TIMES DAILY AS NEEDED     Pulmonology:  Beta Agonists 2 Passed - 12/09/2023  2:56 PM      Passed - Last BP in normal range    BP Readings from Last 1 Encounters:  05/23/23 128/68         Passed - Last Heart Rate in normal range    Pulse Readings from Last 1 Encounters:  05/23/23 73         Passed - Valid encounter within last 12 months    Recent Outpatient Visits           6 months ago Annual physical exam   [place] [facility] at [facility], Nyoka Cowden, MD   7 months ago Mild intermittent asthma with exacerbation   [facility] at [facility], Melton Alar, [place]   1 year ago Acute cough   [place] [facility] at [facility], Nyoka Cowden, MD   1 year ago Annual physical exam   [facility] at [facility], Nyoka Cowden, MD   2 years ago Annual physical exam   [facility] at [facility], Nyoka Cowden, MD       Future Appointments             In 5 months Judithann Graves Nyoka Cowden, MD [facility] Primary Care & [facility] at [facility], [facility]             FLUoxetine (PROZAC) 40 MG capsule 90 capsule 3    Sig: TAKE 1 CAPSULE BY MOUTH EVERY DAY     Psychiatry:   Antidepressants - SSRI Passed - 12/09/2023  2:56 PM      Passed - Completed PHQ-2 or PHQ-9 in the last 360 days      Passed - Valid encounter within last 6 months    Recent Outpatient Visits           6 months ago Annual physical exam   [facility] Medicine at [facility], Nyoka Cowden, MD   7 months ago Mild intermittent asthma with exacerbation   [facility] Primary [facility] & Sports Medicine at [facility], Melton Alar, [place]   1 year ago Acute cough   [place] [facility] Medicine at [facility], Nyoka Cowden, MD   1 year ago Annual physical exam   [facility] Primary Care & [facility] Medicine at [facility], Nyoka Cowden, MD   2 years ago Annual physical exam   [facility] at [facility], Nyoka Cowden, MD       Future  Appointments             In 5 months Judithann Graves, Nyoka Cowden, MD [facility] Health Primary Care & Sports Medicine at [facility], [facility]             fluticasone-salmeterol [facility] INHUB) 100-50 MCG/ACT AEPB 60 each 5     Pulmonology:  Combination Products Passed - 12/09/2023  2:56 PM      Passed - Valid encounter within last 12 months    Recent Outpatient Visits           6 months ago Annual physical exam   [place] [facility] at [facility] Nyoka Cowden, MD   7 months ago Mild intermittent asthma with exacerbation   [facility] Medicine at  [facility], Melton Alar, [place]   1 year ago Acute cough   [place] [facility] at [facility], Nyoka Cowden, MD   1 year ago Annual physical exam   [facility] Primary Care [facility] Sports Medicine at [facility], Nyoka Cowden, MD   2 years ago Annual physical exam   [facility] at [facility], Nyoka Cowden, MD       Future Appointments             In 5 months Judithann Graves Nyoka Cowden, MD  [facility] Primary Care & Sports Medicine at [facility], [facility]             traZODone (DESYREL) 50 MG tablet 90 tablet 3    Sig: TAKE 1 TABLET BY MOUTH EVERYDAY AT BEDTIME     Psychiatry: Antidepressants - Serotonin Modulator Passed - 12/09/2023  2:56 PM      Passed - Completed PHQ-2 or PHQ-9 in the last 360 days      Passed - Valid encounter within last 6 months    Recent Outpatient Visits           6 months ago Annual physical exam   [facility] Care & [facility] Medicine at [facility], Nyoka Cowden, MD   7 months ago Mild intermittent asthma with exacerbation   [facility] Primary Care & Sports Medicine at [facility], Melton Alar, [place]   1 year ago Acute cough   [place] [facility] at [facility], Nyoka Cowden, MD   1 year ago Annual physical exam   [facility] Primary Care & Sports Medicine at [facility], Nyoka Cowden, MD   2 years ago Annual physical exam   [facility] & [facility] at [facility], Nyoka Cowden, MD       Future Appointments             In 5 months Judithann Graves, Nyoka Cowden, MD [facility] Health Primary Care & Sports Medicine at [facility], [facility]

## 2024-01-20 ENCOUNTER — Other Ambulatory Visit: Payer: Self-pay

## 2024-01-20 DIAGNOSIS — F324 Major depressive disorder, single episode, in partial remission: Secondary | ICD-10-CM

## 2024-01-20 DIAGNOSIS — R051 Acute cough: Secondary | ICD-10-CM

## 2024-01-20 DIAGNOSIS — J4520 Mild intermittent asthma, uncomplicated: Secondary | ICD-10-CM

## 2024-01-20 MED ORDER — FLUOXETINE HCL 40 MG PO CAPS
ORAL_CAPSULE | ORAL | 3 refills | Status: DC
Start: 2024-01-20 — End: 2024-01-24

## 2024-01-20 MED ORDER — ALBUTEROL SULFATE HFA 108 (90 BASE) MCG/ACT IN AERS
INHALATION_SPRAY | RESPIRATORY_TRACT | 5 refills | Status: DC
Start: 2024-01-20 — End: 2024-01-24

## 2024-01-20 MED ORDER — TRAZODONE HCL 50 MG PO TABS: ORAL_TABLET | ORAL | 3 refills | Status: DC

## 2024-01-20 MED ORDER — FLUTICASONE-SALMETEROL 100-50 MCG/ACT IN AEPB
1.0000 | INHALATION_SPRAY | Freq: Two times a day (BID) | RESPIRATORY_TRACT | 12 refills | Status: DC
Start: 2024-01-20 — End: 2024-01-24

## 2024-01-20 NOTE — Telephone Encounter (Signed)
 Patient switched to [facility].

## 2024-01-24 ENCOUNTER — Other Ambulatory Visit: Payer: Self-pay | Admitting: Internal Medicine

## 2024-01-24 DIAGNOSIS — R051 Acute cough: Secondary | ICD-10-CM

## 2024-01-24 DIAGNOSIS — F324 Major depressive disorder, single episode, in partial remission: Secondary | ICD-10-CM

## 2024-01-24 DIAGNOSIS — J4520 Mild intermittent asthma, uncomplicated: Secondary | ICD-10-CM

## 2024-01-24 MED ORDER — FLUTICASONE-SALMETEROL 100-50 MCG/ACT IN AEPB: 1.0000 | INHALATION_SPRAY | Freq: Two times a day (BID) | RESPIRATORY_TRACT | 3 refills | Status: AC

## 2024-01-24 MED ORDER — TRAZODONE HCL 50 MG PO TABS: ORAL_TABLET | ORAL | 3 refills | Status: AC

## 2024-01-24 MED ORDER — FLUOXETINE HCL 40 MG PO CAPS
ORAL_CAPSULE | ORAL | 3 refills | Status: AC
Start: 2024-01-24 — End: ?

## 2024-01-24 MED ORDER — ALBUTEROL SULFATE HFA 108 (90 BASE) MCG/ACT IN AERS: INHALATION_SPRAY | RESPIRATORY_TRACT | 1 refills | Status: AC

## 2024-01-24 NOTE — Telephone Encounter (Signed)
 Copied from CRM [id number]. Topic: Clinical - Prescription Issue >> Jan 24, 2024  9:50 AM Oddis Bench wrote: Reason for CRM: Patient is calling to get [facility]st [facility] she has refills in but would like them to be filled by [facility] albuterol (VENTOLIN HFA) 108 (90 Base) MCG/ACT inhaler, fluticasone-salmeterol (WIXELA INHUB) 100-50 MCG/ACT AEPB, traZODone (DESYREL) 50 MG tablet and FLUoxetine (PROZAC) 40 MG capsule. [facility] stated that [facility]

## 2024-01-24 NOTE — Addendum Note (Signed)
 Addended by: Alfonza Angry on: 01/24/2024 10:40 AM   Modules accepted: Orders

## 2024-01-24 NOTE — Telephone Encounter (Signed)
 Spoke with patient, made her aware of the pharmacy correction. Patient verbalized understanding.

## 2024-03-12 DIAGNOSIS — M5412 Radiculopathy, cervical region: Secondary | ICD-10-CM | POA: Diagnosis not present

## 2024-03-12 DIAGNOSIS — M7502 Adhesive capsulitis of left shoulder: Secondary | ICD-10-CM | POA: Diagnosis not present

## 2024-03-12 DIAGNOSIS — M7542 Impingement syndrome of left shoulder: Secondary | ICD-10-CM | POA: Diagnosis not present

## 2024-03-19 ENCOUNTER — Ambulatory Visit (INDEPENDENT_AMBULATORY_CARE_PROVIDER_SITE_OTHER): Admitting: Internal Medicine

## 2024-03-19 ENCOUNTER — Ambulatory Visit: Payer: Self-pay | Admitting: Internal Medicine

## 2024-03-19 ENCOUNTER — Ambulatory Visit
Admission: RE | Admit: 2024-03-19 | Discharge: 2024-03-19 | Disposition: A | Discharge status: Home / Self Care | Source: Ambulatory Visit | Attending: Internal Medicine | Admitting: Internal Medicine

## 2024-03-19 ENCOUNTER — Ambulatory Visit: Payer: Self-pay

## 2024-03-19 ENCOUNTER — Ambulatory Visit
Admission: RE | Admit: 2024-03-19 | Discharge: 2024-03-19 | Disposition: A | Discharge status: Home / Self Care | Attending: Internal Medicine | Admitting: Internal Medicine

## 2024-03-19 VITALS — BP 138/80 | HR 71 | Temp 98.0°F | Ht 64.0 in | Wt 145.2 lb

## 2024-03-19 DIAGNOSIS — J4521 Mild intermittent asthma with (acute) exacerbation: Secondary | ICD-10-CM | POA: Diagnosis not present

## 2024-03-19 DIAGNOSIS — J45909 Unspecified asthma, uncomplicated: Secondary | ICD-10-CM | POA: Diagnosis not present

## 2024-03-19 DIAGNOSIS — R059 Cough, unspecified: Secondary | ICD-10-CM | POA: Diagnosis not present

## 2024-03-19 MED ORDER — DOXYCYCLINE HYCLATE 100 MG PO TABS: 100.0000 mg | ORAL_TABLET | Freq: Two times a day (BID) | ORAL | 0 refills | Status: AC

## 2024-03-19 MED ORDER — MONTELUKAST SODIUM 10 MG PO TABS: 10.0000 mg | ORAL_TABLET | Freq: Every day | ORAL | 0 refills | Status: DC

## 2024-03-19 NOTE — Progress Notes (Signed)
 Date:  03/19/2024   Name:  Janet Cook   DOB:  May 24, 1956   MRN:  [id number]   Chief Complaint: Shortness of Breath (Patient said she is having sob since last Wednesday, has been using OTC allergy medicine but no relief) and Cough (Patient said she has been having the cough for 3 weeks, on and off)  Cough This is a new problem. The current episode started 1 to 4 weeks ago. The problem occurs every few minutes. The cough is Productive of sputum. Associated symptoms include shortness of breath and wheezing. Pertinent negatives include no chest pain, chills, fever or headaches. She has tried a beta-agonist inhaler, steroid inhaler and oral steroids for the symptoms. The treatment provided mild relief. Her past medical history is significant for asthma.  Steroid taper from Ortho has not helped.  She does get temporary relief from albuterol  inhaler and uses Advair daily.  Review of Systems  Constitutional:  Positive for fatigue. Negative for chills and fever.  HENT:  Negative for trouble swallowing.   Respiratory:  Positive for chest tightness, shortness of breath and wheezing.   Cardiovascular:  Negative for chest pain and palpitations.  Musculoskeletal:  Positive for arthralgias (left shoulder pain - now taking steroid taper).  Neurological:  Negative for dizziness and headaches.  Psychiatric/Behavioral:  Negative for dysphoric mood and sleep disturbance. The patient is not nervous/anxious.      Lab Results  Component Value Date   NA 141 05/23/2023   K 4.6 05/23/2023   CO2 25 05/23/2023   GLUCOSE 85 05/23/2023   BUN 14 05/23/2023   CREATININE 0.96 05/23/2023   CALCIUM 10.1 05/23/2023   EGFR 64 05/23/2023   GFRNONAA 73 04/08/2020   Lab Results  Component Value Date   CHOL 232 (H) 05/23/2023   HDL 79 05/23/2023   LDLCALC 133 (H) 05/23/2023   TRIG 113 05/23/2023   CHOLHDL 2.9 05/23/2023   Lab Results  Component Value Date   TSH 0.955 05/23/2023   No results found for:  "HGBA1C" Lab Results  Component Value Date   WBC 7.2 05/23/2023   HGB 14.8 05/23/2023   HCT 45.9 05/23/2023   MCV 93 05/23/2023   PLT 282 05/23/2023   Lab Results  Component Value Date   ALT 22 05/23/2023   AST 22 05/23/2023   ALKPHOS 61 05/23/2023   BILITOT 0.4 05/23/2023   Lab Results  Component Value Date   VD25OH 44.2 04/10/2021     Patient Active Problem List   Diagnosis Date Noted   Hx of adenomatous colonic polyps    Mild hyperlipidemia 03/31/2019   SCCA (squamous cell carcinoma) of skin 03/30/2019   Asthma, mild intermittent 08/11/2015   Allergy 08/11/2015   Genital herpes simplex type 2 08/11/2015   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) 08/11/2015   OP (osteoporosis) 08/11/2015   Idiopathic insomnia 08/11/2015   Avitaminosis D 08/11/2015    No Known Allergies  Past Surgical History:  Procedure Laterality Date   BREAST BIOPSY Right 2008   CORE - NEG   COLONOSCOPY  08/11/2014   COLONOSCOPY WITH PROPOFOL  N/A 07/07/2020   Procedure: COLONOSCOPY WITH PROPOFOL ;  Surgeon: Selena Daily, MD;  Location: [facility] ENDOSCOPY;  Service: Gastroenterology;  Laterality: N/A;   LAPAROSCOPY     endometriosis    Social History   Tobacco Use   Smoking status: Never   Smokeless tobacco: Never  Vaping Use   Vaping status: Never Used  Substance Use Topics  Alcohol use: Yes    Alcohol/week: 1.0 standard drink of alcohol    Types: 1 Standard drinks or equivalent per week    Comment: occasional   Drug use: No     Medication list has been reviewed and updated.  Current Meds  Medication Sig   albuterol  (VENTOLIN  HFA) 108 (90 Base) MCG/ACT inhaler INHALE TWO PUFFS FOUR TIMES DAILY AS NEEDED   Calcium Carbonate-Vit D-Min (CALTRATE 600+D PLUS PO) Take by mouth.   doxycycline (VIBRA-TABS) 100 MG tablet Take 1 tablet (100 mg total) by mouth 2 (two) times daily for 10 days.   fexofenadine (ALLEGRA) 60 MG tablet Take 60 mg by mouth 2 (two) times daily.    FLUoxetine  (PROZAC ) 40 MG capsule TAKE 1 CAPSULE BY MOUTH EVERY DAY   Fluticasone  Propionate, Inhal, (FLUTICASONE  PROPIONATE DISKUS) 100 MCG/ACT AEPB Inhale 100 mcg into the lungs in the morning and at bedtime.   fluticasone -salmeterol (WIXELA INHUB) 100-50 MCG/ACT AEPB Inhale 1 puff into the lungs 2 (two) times daily.   montelukast (SINGULAIR) 10 MG tablet Take 1 tablet (10 mg total) by mouth at bedtime.   traZODone  (DESYREL ) 50 MG tablet TAKE 1 TABLET BY MOUTH EVERYDAY AT BEDTIME   valACYclovir  (VALTREX ) 1000 MG tablet TAKE 2 TABLETS (2,000 MG TOTAL) BY MOUTH TWICE A DAY (Patient taking differently: Take 1,000 mg by mouth daily as needed.)       03/19/2024   10:04 AM 05/23/2023    9:21 AM 05/02/2023    2:05 PM 08/25/2022    2:56 PM  GAD 7 : Generalized Anxiety Score  Nervous, Anxious, on Edge 0 0 0 0  Control/stop worrying 0 0 0 0  Worry too much - different things 0 0 0 0  Trouble relaxing 0 0 0 0  Restless 0 0 0 0  Easily annoyed or irritable 0 0 0 0  Afraid - awful might happen 0 0 0 0  Total GAD 7 Score 0 0 0 0  Anxiety Difficulty Not difficult at all Not difficult at all Not difficult at all Not difficult at all       03/19/2024   10:04 AM 07/19/2023    1:08 PM 05/23/2023    9:21 AM  Depression screen PHQ 2/9  Decreased Interest 0 0 0  Down, Depressed, Hopeless 0 0 0  PHQ - 2 Score 0 0 0  Altered sleeping 0 0 0  Tired, decreased energy 0 0 0  Change in appetite 0 0 0  Feeling bad or failure about yourself  0 0 0  Trouble concentrating 0 0 0  Moving slowly or fidgety/restless 0 0 0  Suicidal thoughts 0 0 0  PHQ-9 Score 0 0 0  Difficult doing work/chores Not difficult at all Not difficult at all Not difficult at all    BP Readings from Last 3 Encounters:  03/19/24 138/80  05/23/23 128/68  05/02/23 122/86    Physical Exam Vitals and nursing note reviewed.  Constitutional:      General: She is not in acute distress.    Appearance: She is well-developed.  HENT:      Head: Normocephalic and atraumatic.  Cardiovascular:     Rate and Rhythm: Normal rate and regular rhythm.     Heart sounds: Normal heart sounds.  Pulmonary:     Effort: Pulmonary effort is normal. No respiratory distress.     Breath sounds: Examination of the right-upper field reveals decreased breath sounds. Examination of the left-upper field reveals decreased breath  sounds. Decreased breath sounds present. No wheezing or rhonchi.  Musculoskeletal:     Cervical back: Normal range of motion.  Lymphadenopathy:     Cervical: No cervical adenopathy.  Skin:    General: Skin is warm and dry.     Findings: No rash.  Neurological:     Mental Status: She is alert and oriented to person, place, and time.  Psychiatric:        Mood and Affect: Mood normal.        Behavior: Behavior normal.     Wt Readings from Last 3 Encounters:  03/19/24 145 lb 4 oz (65.9 kg)  05/23/23 144 lb 6.4 oz (65.5 kg)  05/02/23 141 lb (64 kg)    BP 138/80   Pulse 71   Temp 98 F (36.7 C)   Ht 5\' 4"  (1.626 m)   Wt 145 lb 4 oz (65.9 kg)   SpO2 96%   BMI 24.93 kg/m   Assessment and Plan:  Problem List Items Addressed This Visit   None Visit Diagnoses       Mild intermittent asthma with exacerbation    -  Primary   has not responded to steroid taper; will continue Advair and albuterol  add Singulair; start Doxy and get CXR   Relevant Medications   Fluticasone  Propionate, Inhal, (FLUTICASONE  PROPIONATE DISKUS) 100 MCG/ACT AEPB   doxycycline (VIBRA-TABS) 100 MG tablet   montelukast (SINGULAIR) 10 MG tablet   Other Relevant Orders   DG Chest 2 View       No follow-ups on file.    Sheron Dixons, MD [facility] [place]

## 2024-03-19 NOTE — Telephone Encounter (Signed)
  Chief Complaint: SOB Symptoms: SOB with rest and activity, cough, chest congestion and rattling  Frequency: 03/14/24 Pertinent Negatives: Patient denies fever Disposition: [] ED /[] Urgent Care (no appt availability in office) / [x] Appointment(In office/virtual)/ []  [facility]/ [] Home Care/ [] Refused Recommended Disposition /[] [facility]/ []  Follow-up with PCP Additional Notes: pt states she has been taking allergy medicine and asthma inhalers but nothing has helped with sx. Scheduled OV today 1000 with PCP.   Copied from CRM [id number]. Topic: Clinical - Red Word Triage >> Mar 19, 2024  8:33 AM Elle L wrote: Red Word that prompted transfer to Nurse Triage: The patient has severe chest congestion that is making it difficult for her to breathe. Reason for Disposition  [1] MILD difficulty breathing (e.g., minimal/no SOB at rest, SOB with walking, pulse <100) AND [2] NEW-onset or WORSE than normal  Answer Assessment - Initial Assessment Questions 1. RESPIRATORY STATUS: "Describe your breathing?" (e.g., wheezing, shortness of breath, unable to speak, severe coughing)      SOB 2. ONSET: "When did this breathing problem begin?"      03/14/24 3. PATTERN "Does the difficult breathing come and go, or has it been constant since it started?"      Comes and goes  4. SEVERITY: "How bad is your breathing?" (e.g., mild, moderate, severe)    - MILD: No SOB at rest, mild SOB with walking, speaks normally in sentences, can lie down, no retractions, pulse < 100.    - MODERATE: SOB at rest, SOB with minimal exertion and prefers to sit, cannot lie down flat, speaks in phrases, mild retractions, audible wheezing, pulse 100-120.    - SEVERE: Very SOB at rest, speaks in single words, struggling to breathe, sitting hunched forward, retractions, pulse > 120      Mild to moderate  7. LUNG HISTORY: "Do you have any history of lung disease?"  (e.g., pulmonary embolus, asthma, emphysema)     Asthma   8. CAUSE: "What do you think is causing the breathing problem?"      URI  9. OTHER SYMPTOMS: "Do you have any other symptoms? (e.g., dizziness, runny nose, cough, chest pain, fever)     Cough with congestion, rattling in chest  Protocols used: Breathing Difficulty-A-AH

## 2024-03-27 DIAGNOSIS — M25512 Pain in left shoulder: Secondary | ICD-10-CM | POA: Diagnosis not present

## 2024-04-10 ENCOUNTER — Other Ambulatory Visit: Payer: Self-pay | Admitting: Internal Medicine

## 2024-04-10 DIAGNOSIS — J4521 Mild intermittent asthma with (acute) exacerbation: Secondary | ICD-10-CM

## 2024-04-11 NOTE — Telephone Encounter (Signed)
 Requested Prescriptions  Pending Prescriptions Disp Refills   montelukast  (SINGULAIR ) 10 MG tablet [Pharmacy Med Name: MONTELUKAST  SOD 10 MG TABLET] 90 tablet 0    Sig: TAKE 1 TABLET BY MOUTH EVERYDAY AT BEDTIME     Pulmonology:  Leukotriene Inhibitors Failed - 04/11/2024  4:25 PM      Failed - Valid encounter within last 12 months    Recent Outpatient Visits           3 weeks ago Mild intermittent asthma with exacerbation   [place] [facility] at [facility], Leita DEL, MD       Future Appointments             In 1 month Justus, Leita DEL, MD [facility] at [facility], [facility]

## 2024-04-18 ENCOUNTER — Other Ambulatory Visit: Payer: Self-pay | Admitting: Internal Medicine

## 2024-04-18 DIAGNOSIS — Z1231 Encounter for screening mammogram for malignant neoplasm of breast: Secondary | ICD-10-CM

## 2024-04-20 DIAGNOSIS — M542 Cervicalgia: Secondary | ICD-10-CM | POA: Diagnosis not present

## 2024-04-20 DIAGNOSIS — M7502 Adhesive capsulitis of left shoulder: Secondary | ICD-10-CM | POA: Diagnosis not present

## 2024-04-20 DIAGNOSIS — M25512 Pain in left shoulder: Secondary | ICD-10-CM | POA: Diagnosis not present

## 2024-04-27 ENCOUNTER — Other Ambulatory Visit: Payer: Self-pay | Admitting: Internal Medicine

## 2024-04-27 DIAGNOSIS — J4521 Mild intermittent asthma with (acute) exacerbation: Secondary | ICD-10-CM

## 2024-04-27 NOTE — Telephone Encounter (Signed)
 Copied from CRM [id number]. Topic: Clinical - Medication Refill >> Apr 27, 2024 10:36 AM Elle L wrote: Medication: montelukast  (SINGULAIR ) 10 MG tablet  Has the patient contacted their pharmacy? Yes, pharmacy called on behalf of the patient.  This is the patient's preferred pharmacy:  [facility] - [place], [place] - [address] [place] [address] Phone: [phone] Fax: [phone]  Is this the correct pharmacy for this prescription? Yes  Has the prescription been filled recently? Yes  Is the patient out of the medication? No  Has the patient been seen for an appointment in the last year OR does the patient have an upcoming appointment? Yes  Can we respond through MyChart? No  Agent: Please be advised that Rx refills may take up to 3 business days. We ask that you follow-up with your pharmacy.

## 2024-05-02 DIAGNOSIS — D2272 Melanocytic nevi of left lower limb, including hip: Secondary | ICD-10-CM | POA: Diagnosis not present

## 2024-05-02 DIAGNOSIS — D2262 Melanocytic nevi of left upper limb, including shoulder: Secondary | ICD-10-CM | POA: Diagnosis not present

## 2024-05-02 DIAGNOSIS — L820 Inflamed seborrheic keratosis: Secondary | ICD-10-CM | POA: Diagnosis not present

## 2024-05-02 DIAGNOSIS — D2261 Melanocytic nevi of right upper limb, including shoulder: Secondary | ICD-10-CM | POA: Diagnosis not present

## 2024-05-02 DIAGNOSIS — L739 Follicular disorder, unspecified: Secondary | ICD-10-CM | POA: Diagnosis not present

## 2024-05-02 DIAGNOSIS — Z85828 Personal history of other malignant neoplasm of skin: Secondary | ICD-10-CM | POA: Diagnosis not present

## 2024-05-02 DIAGNOSIS — D2271 Melanocytic nevi of right lower limb, including hip: Secondary | ICD-10-CM | POA: Diagnosis not present

## 2024-05-02 DIAGNOSIS — Z08 Encounter for follow-up examination after completed treatment for malignant neoplasm: Secondary | ICD-10-CM | POA: Diagnosis not present

## 2024-05-02 DIAGNOSIS — D225 Melanocytic nevi of trunk: Secondary | ICD-10-CM | POA: Diagnosis not present

## 2024-05-02 DIAGNOSIS — L821 Other seborrheic keratosis: Secondary | ICD-10-CM | POA: Diagnosis not present

## 2024-05-24 ENCOUNTER — Encounter: Payer: Self-pay | Admitting: Internal Medicine

## 2024-05-24 ENCOUNTER — Ambulatory Visit (INDEPENDENT_AMBULATORY_CARE_PROVIDER_SITE_OTHER): Payer: Self-pay | Admitting: Internal Medicine

## 2024-05-24 VITALS — BP 104/66 | HR 73 | Ht 64.0 in | Wt 143.4 lb

## 2024-05-24 DIAGNOSIS — Z23 Encounter for immunization: Secondary | ICD-10-CM

## 2024-05-24 DIAGNOSIS — F5101 Primary insomnia: Secondary | ICD-10-CM

## 2024-05-24 DIAGNOSIS — M810 Age-related osteoporosis without current pathological fracture: Secondary | ICD-10-CM

## 2024-05-24 DIAGNOSIS — E785 Hyperlipidemia, unspecified: Secondary | ICD-10-CM

## 2024-05-24 DIAGNOSIS — Z1231 Encounter for screening mammogram for malignant neoplasm of breast: Secondary | ICD-10-CM | POA: Diagnosis not present

## 2024-05-24 DIAGNOSIS — J4520 Mild intermittent asthma, uncomplicated: Secondary | ICD-10-CM

## 2024-05-24 DIAGNOSIS — F324 Major depressive disorder, single episode, in partial remission: Secondary | ICD-10-CM

## 2024-05-24 DIAGNOSIS — Z0000 Encounter for general adult medical examination without abnormal findings: Secondary | ICD-10-CM | POA: Diagnosis not present

## 2024-05-24 MED ORDER — VALACYCLOVIR HCL 1 G PO TABS: 1000.0000 mg | ORAL_TABLET | Freq: Two times a day (BID) | ORAL | 0 refills | Status: DC

## 2024-05-24 NOTE — Progress Notes (Signed)
 Date:  05/24/2024   Name:  Janet Cook   DOB:  [birth year]   MRN:  [id number]   Chief Complaint: Annual Exam Janet Cook is a 68 y.o. female who presents today for her Complete Annual Exam. She feels well. She reports exercising 3 times a week. She reports she is sleeping well. Breast complaints none.  Health Maintenance  Topic Date Due   Medicare Annual Wellness Visit  07/18/2024   COVID-19 Vaccine (6 - 2024-25 season) 06/09/2024*   Flu Shot  01/08/2025*   Mammogram  06/01/2024   DEXA scan (bone density measurement)  06/29/2024   Colon Cancer Screening  07/08/2027   DTaP/Tdap/Td vaccine (3 - Td or Tdap) 05/24/2034   Pneumococcal Vaccine for age over 80  Completed   Zoster (Shingles) Vaccine  Completed   Hepatitis C Screening  Addressed   HPV Vaccine  Aged Out   Meningitis B Vaccine  Aged Out  *Topic was postponed. The date shown is not the original due date.    Depression        This is a chronic problem.The problem is unchanged.  Associated symptoms include insomnia.  Associated symptoms include no fatigue, no myalgias and no headaches.  Past treatments include SSRIs - Selective serotonin reuptake inhibitors.  Compliance with treatment is good. Insomnia Primary symptoms: sleep disturbance.   Past treatments include medication. The treatment provided significant relief. PMH includes: depression.   Asthma She complains of chest tightness, cough and wheezing. There is no shortness of breath. This is a recurrent problem. The problem occurs intermittently. Pertinent negatives include no chest pain, headaches, myalgias or trouble swallowing. Her symptoms are alleviated by steroid inhaler and beta-agonist. She reports significant improvement on treatment. Her past medical history is significant for asthma.  OP - on calcium and vitamin d; recent DEXA 2023 showed stable improvement with normal spine and osteopenia of the hip. Finger laceration - left index finger cut on old razor  blade yesterday at home.  Tdap is due.  Review of Systems  Constitutional:  Negative for fatigue and unexpected weight change.  HENT:  Negative for trouble swallowing.   Eyes:  Negative for visual disturbance.  Respiratory:  Positive for cough and wheezing. Negative for chest tightness and shortness of breath.   Cardiovascular:  Negative for chest pain, palpitations and leg swelling.  Gastrointestinal:  Negative for abdominal pain, constipation and diarrhea.  Genitourinary:  Negative for hematuria and urgency.  Musculoskeletal:  Negative for arthralgias and myalgias.  Skin:  Positive for wound.  Neurological:  Negative for dizziness, weakness, light-headedness and headaches.  Psychiatric/Behavioral:  Positive for depression and sleep disturbance. Negative for dysphoric mood. The patient has insomnia. The patient is not nervous/anxious.      Lab Results  Component Value Date   NA 141 05/23/2023   K 4.6 05/23/2023   CO2 25 05/23/2023   GLUCOSE 85 05/23/2023   BUN 14 05/23/2023   CREATININE 0.96 05/23/2023   CALCIUM 10.1 05/23/2023   EGFR 64 05/23/2023   GFRNONAA 73 04/08/2020   Lab Results  Component Value Date   CHOL 232 (H) 05/23/2023   HDL 79 05/23/2023   LDLCALC 133 (H) 05/23/2023   TRIG 113 05/23/2023   CHOLHDL 2.9 05/23/2023   Lab Results  Component Value Date   TSH 0.955 05/23/2023   No results found for: HGBA1C Lab Results  Component Value Date   WBC 7.2 05/23/2023   HGB 14.8 05/23/2023   HCT 45.9 05/23/2023  MCV 93 05/23/2023   PLT 282 05/23/2023   Lab Results  Component Value Date   ALT 22 05/23/2023   AST 22 05/23/2023   ALKPHOS 61 05/23/2023   BILITOT 0.4 05/23/2023   Lab Results  Component Value Date   VD25OH 44.2 04/10/2021     Patient Active Problem List   Diagnosis Date Noted   Hx of adenomatous colonic polyps    Mild hyperlipidemia 03/31/2019   SCCA (squamous cell carcinoma) of skin 03/30/2019   Asthma, mild intermittent 08/11/2015    Allergy 08/11/2015   Genital herpes simplex type 2 08/11/2015   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) 08/11/2015   OP (osteoporosis) 08/11/2015   Idiopathic insomnia 08/11/2015   Avitaminosis D 08/11/2015    No Known Allergies  Past Surgical History:  Procedure Laterality Date   BREAST BIOPSY Right 2008   CORE - NEG   COLONOSCOPY  08/11/2014   COLONOSCOPY WITH PROPOFOL  N/A 07/07/2020   Procedure: COLONOSCOPY WITH PROPOFOL ;  Surgeon: Unk Corinn Skiff, MD;  Location: [facility] ENDOSCOPY;  Service: Gastroenterology;  Laterality: N/A;   LAPAROSCOPY     endometriosis    Social History   Tobacco Use   Smoking status: Never   Smokeless tobacco: Never  Vaping Use   Vaping status: Never Used  Substance Use Topics   Alcohol use: Yes    Alcohol/week: 1.0 standard drink of alcohol    Types: 1 Standard drinks or equivalent per week    Comment: occasional   Drug use: No     Medication list has been reviewed and updated.  Current Meds  Medication Sig   albuterol  (VENTOLIN  HFA) 108 (90 Base) MCG/ACT inhaler INHALE TWO PUFFS FOUR TIMES DAILY AS NEEDED   Calcium Carbonate-Vit D-Min (CALTRATE 600+D PLUS PO) Take by mouth.   fexofenadine (ALLEGRA) 60 MG tablet Take 60 mg by mouth 2 (two) times daily.   FLUoxetine  (PROZAC ) 40 MG capsule TAKE 1 CAPSULE BY MOUTH EVERY DAY   fluticasone -salmeterol (WIXELA INHUB) 100-50 MCG/ACT AEPB Inhale 1 puff into the lungs 2 (two) times daily.   montelukast  (SINGULAIR ) 10 MG tablet TAKE 1 TABLET BY MOUTH EVERYDAY AT BEDTIME   traZODone  (DESYREL ) 50 MG tablet TAKE 1 TABLET BY MOUTH EVERYDAY AT BEDTIME   [DISCONTINUED] Fluticasone  Propionate, Inhal, (FLUTICASONE  PROPIONATE DISKUS) 100 MCG/ACT AEPB Inhale 100 mcg into the lungs in the morning and at bedtime.   [DISCONTINUED] valACYclovir  (VALTREX ) 1000 MG tablet TAKE 2 TABLETS (2,000 MG TOTAL) BY MOUTH TWICE A DAY (Patient taking differently: Take 1,000 mg by mouth daily as needed.)        05/24/2024    8:32 AM 03/19/2024   10:04 AM 05/23/2023    9:21 AM 05/02/2023    2:05 PM  GAD 7 : Generalized Anxiety Score  Nervous, Anxious, on Edge 0 0 0 0  Control/stop worrying 0 0 0 0  Worry too much - different things 0 0 0 0  Trouble relaxing 0 0 0 0  Restless 0 0 0 0  Easily annoyed or irritable 0 0 0 0  Afraid - awful might happen 0 0 0 0  Total GAD 7 Score 0 0 0 0  Anxiety Difficulty Not difficult at all Not difficult at all Not difficult at all Not difficult at all       05/24/2024    8:31 AM 03/19/2024   10:04 AM 07/19/2023    1:08 PM  Depression screen PHQ 2/9  Decreased Interest 0 0 0  Down, Depressed, Hopeless 0 0 0  PHQ - 2 Score 0 0 0  Altered sleeping 0 0 0  Tired, decreased energy 0 0 0  Change in appetite 0 0 0  Feeling bad or failure about yourself  0 0 0  Trouble concentrating 0 0 0  Moving slowly or fidgety/restless 0 0 0  Suicidal thoughts 0 0 0  PHQ-9 Score 0 0 0  Difficult doing work/chores Not difficult at all Not difficult at all Not difficult at all    BP Readings from Last 3 Encounters:  05/24/24 104/66  03/19/24 138/80  05/23/23 128/68    Physical Exam Vitals and nursing note reviewed.  Constitutional:      General: She is not in acute distress.    Appearance: She is well-developed.  HENT:     Head: Normocephalic and atraumatic.     Right Ear: Tympanic membrane and ear canal normal.     Left Ear: Tympanic membrane and ear canal normal.     Nose:     Right Sinus: No maxillary sinus tenderness.     Left Sinus: No maxillary sinus tenderness.  Eyes:     General: No scleral icterus.       Right eye: No discharge.        Left eye: No discharge.     Conjunctiva/sclera: Conjunctivae normal.  Neck:     Thyroid: No thyromegaly.     Vascular: No carotid bruit.  Cardiovascular:     Rate and Rhythm: Normal rate and regular rhythm.     Pulses: Normal pulses.     Heart sounds: Normal heart sounds.  Pulmonary:     Effort: Pulmonary effort is  normal. No respiratory distress.     Breath sounds: No wheezing.  Abdominal:     General: Bowel sounds are normal.     Palpations: Abdomen is soft.     Tenderness: There is no abdominal tenderness.  Musculoskeletal:     Cervical back: Normal range of motion. No erythema.     Right lower leg: No edema.     Left lower leg: No edema.  Lymphadenopathy:     Cervical: No cervical adenopathy.  Skin:    General: Skin is warm and dry.     Findings: No rash.     Comments: 1/2 inch superficial laceration at the DIP of her left index finger - no bleeding or s/s if infection   Neurological:     Mental Status: She is alert and oriented to person, place, and time.     Cranial Nerves: No cranial nerve deficit.     Sensory: No sensory deficit.     Deep Tendon Reflexes: Reflexes are normal and symmetric.  Psychiatric:        Attention and Perception: Attention normal.        Mood and Affect: Mood normal.     Wt Readings from Last 3 Encounters:  05/24/24 143 lb 6.4 oz (65 kg)  03/19/24 145 lb 4 oz (65.9 kg)  05/23/23 144 lb 6.4 oz (65.5 kg)    BP 104/66   Pulse 73   Ht 5' 4 (1.626 m)   Wt 143 lb 6.4 oz (65 kg)   SpO2 95%   BMI 24.61 kg/m   Assessment and Plan:  Problem List Items Addressed This Visit       Unprioritized   Asthma, mild intermittent (Chronic)   Asthma sx have been stable - no recent issues. Singulair  has been helpful so  has not used albuterol  recently.       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   No SI or HI on evaluation. Plan to continue same medications for now.       Relevant Orders   TSH   OP (osteoporosis) (Chronic)   Stable osteopenia 2 yrs ago Continue calcium and vitamin D      Relevant Orders   Comprehensive metabolic panel with GFR   Idiopathic insomnia   Sleep quality fairly good with Trazodone  nightly.      Mild  hyperlipidemia (Chronic)   Continue healthy diet and exercise.      Relevant Orders   Lipid panel   Other Visit Diagnoses       Annual physical exam    -  Primary   continue healthy diet and exercise screenings up to date immunizations up to date     Encounter for screening mammogram for breast cancer       scheduled for 8/25     Immunization due       cut her index finger with a razor blade yesterday at home   Relevant Orders   Tdap vaccine greater than or equal to 7yo IM (Completed)       Return in about 6 months (around 11/24/2024) for asthma/depression - Dr. Lemon.    Leita HILARIO Adie, MD [facility] [place]

## 2024-05-24 NOTE — Assessment & Plan Note (Addendum)
 Asthma sx have been stable - no recent issues. Singulair  has been helpful so has not used albuterol  recently.

## 2024-05-24 NOTE — Assessment & Plan Note (Signed)
 Sleep quality fairly good with Trazodone  nightly.

## 2024-05-24 NOTE — Assessment & Plan Note (Signed)
 Stable osteopenia 2 yrs ago Continue calcium and vitamin D

## 2024-05-24 NOTE — Assessment & Plan Note (Signed)
 Clinically stable on Prozac  and trazodone .   No SI or HI on evaluation. Plan to continue same medications for now.

## 2024-05-24 NOTE — Assessment & Plan Note (Signed)
 Continue healthy diet and exercise.

## 2024-05-25 ENCOUNTER — Ambulatory Visit: Payer: Self-pay | Admitting: Internal Medicine

## 2024-05-25 LAB — COMPREHENSIVE METABOLIC PANEL WITH GFR
ALT: 22 IU/L (ref 0–32)
AST: 26 IU/L (ref 0–40)
Albumin: 4.5 g/dL (ref 3.9–4.9)
Alkaline Phosphatase: 58 IU/L (ref 44–121)
BUN/Creatinine Ratio: 15 (ref 12–28)
BUN: 14 mg/dL (ref 8–27)
Bilirubin Total: 0.5 mg/dL (ref 0.0–1.2)
CO2: 25 mmol/L (ref 20–29)
Calcium: 10.1 mg/dL (ref 8.7–10.3)
Chloride: 101 mmol/L (ref 96–106)
Creatinine, Ser: 0.94 mg/dL (ref 0.57–1.00)
Globulin, Total: 2.1 g/dL (ref 1.5–4.5)
Glucose: 89 mg/dL (ref 70–99)
Potassium: 4.8 mmol/L (ref 3.5–5.2)
Sodium: 139 mmol/L (ref 134–144)
Total Protein: 6.6 g/dL (ref 6.0–8.5)
eGFR: 65 mL/min/1.73 (ref >59)

## 2024-05-25 LAB — CBC WITH DIFFERENTIAL/PLATELET
Basophils Absolute: 0 x10E3/uL (ref 0.0–0.2)
Basos: 1 %
EOS (ABSOLUTE): 0.2 x10E3/uL (ref 0.0–0.4)
Eos: 2 %
Hematocrit: 45.1 % (ref 34.0–46.6)
Hemoglobin: 14.7 g/dL (ref 11.1–15.9)
Immature Grans (Abs): 0 x10E3/uL (ref 0.0–0.1)
Immature Granulocytes: 0 %
Lymphocytes Absolute: 1.6 x10E3/uL (ref 0.7–3.1)
Lymphs: 25 %
MCH: 30.9 pg (ref 26.6–33.0)
MCHC: 32.6 g/dL (ref 31.5–35.7)
MCV: 95 fL (ref 79–97)
Monocytes Absolute: 0.6 x10E3/uL (ref 0.1–0.9)
Monocytes: 9 %
Neutrophils Absolute: 4.1 x10E3/uL (ref 1.4–7.0)
Neutrophils: 62 %
Platelets: 311 x10E3/uL (ref 150–450)
RBC: 4.76 x10E6/uL (ref 3.77–5.28)
RDW: 13.2 % (ref 11.7–15.4)
WBC: 6.6 x10E3/uL (ref 3.4–10.8)

## 2024-05-25 LAB — LIPID PANEL
Chol/HDL Ratio: 3 ratio (ref 0.0–4.4)
Cholesterol, Total: 219 mg/dL — ABNORMAL HIGH (ref 100–199)
HDL: 74 mg/dL (ref >39)
LDL Chol Calc (NIH): 129 mg/dL — ABNORMAL HIGH (ref 0–99)
Triglycerides: 94 mg/dL (ref 0–149)
VLDL Cholesterol Cal: 16 mg/dL (ref 5–40)

## 2024-05-25 LAB — TSH: TSH: 0.892 u[IU]/mL (ref 0.450–4.500)

## 2024-05-30 ENCOUNTER — Telehealth: Payer: Self-pay

## 2024-05-30 MED ORDER — VALACYCLOVIR HCL 1 G PO TABS: 1000.0000 mg | ORAL_TABLET | Freq: Two times a day (BID) | ORAL | 3 refills | Status: DC

## 2024-05-30 NOTE — Telephone Encounter (Signed)
 Copied from CRM [id number]. Topic: Clinical - Prescription Issue >> May 29, 2024  3:47 PM Fonda T wrote: Reason for CRM: Reason for CRM: Received call from patient, inquiring on status of medication, valACYclovir  (VALTREX ) 1000 MG. Per patient states pharmacy has faxed request but no response received.  Per chart review, medication was prescribed E-Prescribing Status: Receipt confirmed by pharmacy (05/24/2024  8:55 AM EDT), sent to [facility] - [place], [place] - [address] [place] [address] Phone: [phone]  Fax: [phone].  Patient informed of above, verbalized understanding, per patient states pharmacy needed additional information from provider. States message indicates a new prescription is needed to be electronically escribed or faxed.   Order number #[id number].  Patient given phone number to pharmacy, [facility], of [phone].  Patient can be reached at [phone], if any additional questions.

## 2024-06-04 ENCOUNTER — Ambulatory Visit
Admission: RE | Admit: 2024-06-04 | Discharge: 2024-06-04 | Disposition: A | Discharge status: Home / Self Care | Source: Ambulatory Visit | Attending: Internal Medicine | Admitting: Internal Medicine

## 2024-06-04 DIAGNOSIS — Z1231 Encounter for screening mammogram for malignant neoplasm of breast: Secondary | ICD-10-CM | POA: Diagnosis not present

## 2024-06-05 ENCOUNTER — Telehealth: Payer: Self-pay

## 2024-06-05 MED ORDER — VALACYCLOVIR HCL 1 G PO TABS: 1000.0000 mg | ORAL_TABLET | Freq: Two times a day (BID) | ORAL | 3 refills | Status: AC

## 2024-06-05 NOTE — Telephone Encounter (Signed)
 Copied from CRM #[id number]. Topic: Clinical - Prescription Issue >> Jun 05, 2024 11:34 AM Jasmin G wrote: Reason for CRM: Pt sates that her preferred pharmacy, [facility] - [place], [place] - [address] is having trouble refilling her valACYclovir  (VALTREX ) 1000 MG tablet and called to request for her prescription to be sent to [id number] [address], [place] - [address]

## 2024-06-29 DIAGNOSIS — H43813 Vitreous degeneration, bilateral: Secondary | ICD-10-CM | POA: Diagnosis not present

## 2024-06-29 DIAGNOSIS — H40003 Preglaucoma, unspecified, bilateral: Secondary | ICD-10-CM | POA: Diagnosis not present

## 2024-06-29 DIAGNOSIS — H2513 Age-related nuclear cataract, bilateral: Secondary | ICD-10-CM | POA: Diagnosis not present

## 2024-07-25 ENCOUNTER — Ambulatory Visit

## 2024-08-08 ENCOUNTER — Ambulatory Visit (INDEPENDENT_AMBULATORY_CARE_PROVIDER_SITE_OTHER)

## 2024-08-08 DIAGNOSIS — Z0000 Encounter for general adult medical examination without abnormal findings: Secondary | ICD-10-CM | POA: Diagnosis not present

## 2024-08-08 NOTE — Progress Notes (Signed)
 Subjective:   Janet Cook is a 68 y.o. who presents for a Medicare Wellness preventive visit.  As a reminder, Annual Wellness Visits don't include a physical exam, and some assessments may be limited, especially if this visit is performed virtually. We may recommend an in-person follow-up visit with your provider if needed.  Visit Complete: Virtual I connected with  Janet Cook Read on 08/08/24 by a video and audio enabled telemedicine application and verified that I am speaking with the correct person using two identifiers.  Patient Location: Home  Provider Location: Office/Clinic  I discussed the limitations of evaluation and management by telemedicine. The patient expressed understanding and agreed to proceed.  Vital Signs: Because this visit was a virtual/telehealth visit, some criteria may be missing or patient reported. Any vitals not documented were not able to be obtained and vitals that have been documented are patient reported.   Persons Participating in Visit: Patient.  AWV Questionnaire: No: Patient Medicare AWV questionnaire was not completed prior to this visit.  Cardiac Risk Factors include: advanced age (>65men, >19 women);dyslipidemia     Objective:    There were no vitals filed for this visit. There is no height or weight on file to calculate BMI.     08/08/2024    4:03 PM 07/19/2023    1:09 PM 07/08/2021    8:07 AM 07/07/2020    7:47 AM 06/11/2020    2:12 PM 01/07/2016    8:28 AM  Advanced Directives  Does Patient Have a Medical Advance Directive? No No Yes Yes Yes No   Type of Best Boy of [place];Living will  Healthcare Power of [place];Living will   Copy of Healthcare Power of Attorney in Chart?   No - copy requested  No - copy requested   Would patient like information on creating a medical advance directive? No - Patient declined No - Patient declined    No - patient declined information      Data saved with a previous  flowsheet row definition    Current Medications (verified) Outpatient Encounter Medications as of 08/08/2024  Medication Sig   albuterol  (VENTOLIN  HFA) 108 (90 Base) MCG/ACT inhaler INHALE TWO PUFFS FOUR TIMES DAILY AS NEEDED   Calcium Carbonate-Vit D-Min (CALTRATE 600+D PLUS PO) Take by mouth.   fexofenadine (ALLEGRA) 60 MG tablet Take 60 mg by mouth 2 (two) times daily.   FLUoxetine  (PROZAC ) 40 MG capsule TAKE 1 CAPSULE BY MOUTH EVERY DAY   fluticasone -salmeterol (WIXELA INHUB) 100-50 MCG/ACT AEPB Inhale 1 puff into the lungs 2 (two) times daily.   montelukast  (SINGULAIR ) 10 MG tablet TAKE 1 TABLET BY MOUTH EVERYDAY AT BEDTIME   traZODone  (DESYREL ) 50 MG tablet TAKE 1 TABLET BY MOUTH EVERYDAY AT BEDTIME   valACYclovir  (VALTREX ) 1000 MG tablet Take 1 tablet (1,000 mg total) by mouth 2 (two) times daily. TAKE 2 TABLETS (2,000 MG TOTAL) BY MOUTH TWICE A DAY   No facility-administered encounter medications on file as of 08/08/2024.    Allergies (verified) Patient has no known allergies.   History: Past Medical History:  Diagnosis Date   Anxiety 05/11/1982   Asthma 09/20/1983   Basal cell carcinoma 05/29/2021   back   Depression 05/11/1982   Mild hyperlipidemia    OP (osteoporosis)    SCCA (squamous cell carcinoma) of skin 03/30/2019   Past Surgical History:  Procedure Laterality Date   BREAST BIOPSY Right 2008   CORE - NEG   COLONOSCOPY  08/11/2014  COLONOSCOPY WITH PROPOFOL  N/A 07/07/2020   Procedure: COLONOSCOPY WITH PROPOFOL ;  Surgeon: Unk Corinn Skiff, MD;  Location: [facility] ENDOSCOPY;  Service: Gastroenterology;  Laterality: N/A;   LAPAROSCOPY     endometriosis   Family History  Problem Relation Age of Onset   CAD Father    Aortic aneurysm Father    Asthma Father    Heart failure Mother    Breast cancer Paternal Aunt    Social History   Socioeconomic History   Marital status: Married    Spouse name: Walter Min   Number of children: 0   Years of education:  Not on file   Highest education level: Master's degree (e.g., MA, MS, MEng, MEd, MSW, MBA)  Occupational History   Occupation: retired  Tobacco Use   Smoking status: Never   Smokeless tobacco: Never  Vaping Use   Vaping status: Never Used  Substance and Sexual Activity   Alcohol use: Yes    Alcohol/week: 1.0 standard drink of alcohol    Types: 1 Standard drinks or equivalent per week    Comment: occasional   Drug use: No   Sexual activity: Yes    Birth control/protection: Post-menopausal, None  Other Topics Concern   Not on file  Social History Narrative   2nd marriage; 1st husband passed away 2019-08-18 parkinson's and dementia   Social Drivers of Health   Financial Resource Strain: Low Risk  (08/08/2024)   Overall Financial Resource Strain (CARDIA)    Difficulty of Paying Living Expenses: Not hard at all  Food Insecurity: No Food Insecurity (08/08/2024)   Hunger Vital Sign    Worried About Running Out of Food in the Last Year: Never true    Ran Out of Food in the Last Year: Never true  Transportation Needs: No Transportation Needs (08/08/2024)   PRAPARE - Administrator, Civil Service (Medical): No    Lack of Transportation (Non-Medical): No  Physical Activity: Sufficiently Active (08/08/2024)   Exercise Vital Sign    Days of Exercise per Week: 3 days    Minutes of Exercise per Session: 50 min  Recent Concern: Physical Activity - Insufficiently Active (05/23/2024)   Exercise Vital Sign    Days of Exercise per Week: 3 days    Minutes of Exercise per Session: 30 min  Stress: No Stress Concern Present (08/08/2024)   [employer] of Occupational Health - Occupational Stress Questionnaire    Feeling of Stress: Only a little  Social Connections: Socially Integrated (08/08/2024)   Social Connection and Isolation Panel    Frequency of Communication with Friends and Family: Twice a week    Frequency of Social Gatherings with Friends and Family: Once a week     Attends Religious Services: More than 4 times per year    Active Member of [employer] or Organizations: Yes    Attends Engineer, Structural: More than 4 times per year    Marital Status: Married    Tobacco Counseling Counseling given: Not Answered    Clinical Intake:  Pre-visit preparation completed: Yes  Pain : No/denies pain     BMI - recorded: 24.5 Nutritional Status: BMI of 19-24  Normal Nutritional Risks: None Diabetes: No  No results found for: HGBA1C   How often do you need to have someone help you when you read instructions, pamphlets, or other written materials from your doctor or pharmacy?: 1 - Never  Interpreter Needed?: No  Information entered by :: JHONNIE DAS, LPN   Activities  of Daily Living     08/08/2024    4:04 PM 08/08/2024    3:08 PM  In your present state of health, do you have any difficulty performing the following activities:  Hearing? 0 0  Vision? 0 0  Difficulty concentrating or making decisions? 0 0  Walking or climbing stairs? 0 0  Dressing or bathing? 0 0  Doing errands, shopping? 0 0  Preparing Food and eating ? N N  Using the Toilet? N N  In the past six months, have you accidently leaked urine? N N  Do you have problems with loss of bowel control? N N  Managing your Medications? N N  Managing your Finances? N N  Housekeeping or managing your Housekeeping? N N    Patient Care Team: Justus Leita DEL, MD as PCP - General (Internal Medicine) Dermatology, [place], Corinn Skiff, MD as Consulting Physician (Gastroenterology) Jaye Fallow, MD as Referring Physician (Ophthalmology)  I have updated your Care Teams any recent Medical Services you may have received from other providers in the past year.     Assessment:   This is a routine wellness examination for Janet Cook.  Hearing/Vision screen Hearing Screening - Comments:: NO AIDS Vision Screening - Comments:: WEARS GLASSES ALL DAY- [facility] DR.PORFILIO-  SEEN ONCE PER YEAR   Goals Addressed             This Visit's Progress    DIET - INCREASE WATER INTAKE         Depression Screen     08/08/2024    4:02 PM 05/24/2024    8:31 AM 03/19/2024   10:04 AM 07/19/2023    1:08 PM 05/23/2023    9:21 AM 05/02/2023    2:05 PM 08/25/2022    2:56 PM  PHQ 2/9 Scores  PHQ - 2 Score 0 0 0 0 0 0 0  PHQ- 9 Score 0 0 0 0 0 3 2    Fall Risk     08/08/2024    3:08 PM 05/24/2024    8:31 AM 03/19/2024    9:44 AM 07/19/2023    1:10 PM 05/23/2023    9:21 AM  Fall Risk   Falls in the past year? 0 0 0 0 0  Number falls in past yr: 0 0 0 0 0  Injury with Fall? 0 0 0 0 0  Risk for fall due to : No Fall Risks No Fall Risks No Fall Risks No Fall Risks No Fall Risks  Follow up Falls evaluation completed;Falls prevention discussed Falls evaluation completed Falls evaluation completed Falls prevention discussed;Falls evaluation completed Falls evaluation completed    MEDICARE RISK AT HOME:  Medicare Risk at Home Any stairs in or around the home?: Yes If so, are there any without handrails?: No Home free of loose throw rugs in walkways, pet beds, electrical cords, etc?: Yes Adequate lighting in your home to reduce risk of falls?: Yes Life alert?: No Use of a cane, walker or w/c?: No Grab bars in the bathroom?: Yes Shower chair or bench in shower?: Yes Elevated toilet seat or a handicapped toilet?: Yes  TIMED UP AND GO:  Was the test performed?  No  Cognitive Function: 6CIT completed        08/08/2024    4:05 PM 07/19/2023    1:11 PM 07/16/2022    8:14 AM  6CIT Screen  What Year? 0 points 0 points 0 points  What month? 0 points 0 points 0 points  What time? 0 points 0 points 0 points  Count back from 20 0 points 0 points 0 points  Months in reverse 0 points 0 points 0 points  Repeat phrase 2 points 0 points 4 points  Total Score 2 points 0 points 4 points    Immunizations Immunization History  Administered Date(s) Administered   Fluad  Quad(high Dose 65+) 07/13/2020, 06/28/2022   INFLUENZA, HIGH DOSE SEASONAL PF 07/27/2019, 07/02/2021, 07/19/2023, 06/29/2024   Influenza,inj,Quad PF,6+ Mos 07/17/2017, 07/24/2018   Influenza-Unspecified 07/14/2015, 07/27/2019   PFIZER(Purple Top)SARS-COV-2 Vaccination 11/23/2019, 12/19/2019, 08/11/2020, 02/01/2021   Pfizer Covid-19 Vaccine Bivalent Booster 84yrs & up 07/02/2021   Pneumococcal Conjugate-13 04/08/2020   Pneumococcal Polysaccharide-23 04/10/2021   Tdap 08/25/2011, 05/24/2024   Zoster Recombinant(Shingrix) 07/24/2018, 12/01/2018   Zoster, Live 11/18/2014    Screening Tests Health Maintenance  Topic Date Due   COVID-19 Vaccine (6 - 2025-26 season) 06/11/2024   Mammogram  06/04/2025   DEXA SCAN  06/29/2025   Medicare Annual Wellness (AWV)  08/08/2025   Colonoscopy  07/08/2027   DTaP/Tdap/Td (3 - Td or Tdap) 05/24/2034   Pneumococcal Vaccine: 50+ Years  Completed   Influenza Vaccine  Completed   Zoster Vaccines- Shingrix  Completed   Hepatitis C Screening  Addressed   Meningococcal B Vaccine  Aged Out    Health Maintenance Items Addressed: UTD W/ SHOTS; UTD ON COLONOSCOPY, MAMMOGRAM & BDS   Additional Screening:  Vision Screening: Recommended annual ophthalmology exams for early detection of glaucoma and other disorders of the eye. Is the patient up to date with their annual eye exam?  Yes  Who is the provider or what is the name of the office in which the patient attends annual eye exams? DR.PORFILIO  Dental Screening: Recommended annual dental exams for proper oral hygiene  Community Resource Referral / Chronic Care Management: CRR required this visit?  No   CCM required this visit?  No   Plan:    I have personally reviewed and noted the following in the patient's chart:   Medical and social history Use of alcohol, tobacco or illicit drugs  Current medications and supplements including opioid prescriptions. Patient is not currently taking opioid  prescriptions. Functional ability and status Nutritional status Physical activity Advanced directives List of other physicians Hospitalizations, surgeries, and ER visits in previous 12 months Vitals Screenings to include cognitive, depression, and falls Referrals and appointments  In addition, I have reviewed and discussed with patient certain preventive protocols, quality metrics, and best practice recommendations. A written personalized care plan for preventive services as well as general preventive health recommendations were provided to patient.   Jhonnie GORMAN Das, LPN   [id number]   After Visit Summary: (MyChart) Due to this being a telephonic visit, the after visit summary with patients personalized plan was offered to patient via MyChart   Notes: Nothing significant to report at this time.

## 2024-08-08 NOTE — Patient Instructions (Addendum)
 Janet Cook,  Thank you for taking the time for your Medicare Wellness Visit. I appreciate your continued commitment to your health goals. Please review the care plan we discussed, and feel free to reach out if I can assist you further.  Medicare recommends these wellness visits once per year to help you and your care team stay ahead of potential health issues. These visits are designed to focus on prevention, allowing your provider to concentrate on managing your acute and chronic conditions during your regular appointments.  Please note that Annual Wellness Visits do not include a physical exam. Some assessments may be limited, especially if the visit was conducted virtually. If needed, we may recommend a separate in-person follow-up with your provider.  Ongoing Care Seeing your primary care provider every 3 to 6 months helps us  monitor your health and provide consistent, personalized care.   Referrals If a referral was made during today's visit and you haven't received any updates within two weeks, please contact the referred provider directly to check on the status.  Recommended Screenings:  Health Maintenance  Topic Date Due   COVID-19 Vaccine (6 - 2025-26 season) 06/11/2024   Breast Cancer Screening  06/04/2025   DEXA scan (bone density measurement)  06/29/2025   Medicare Annual Wellness Visit  08/08/2025   Colon Cancer Screening  07/08/2027   DTaP/Tdap/Td vaccine (3 - Td or Tdap) 05/24/2034   Pneumococcal Vaccine for age over 85  Completed   Flu Shot  Completed   Zoster (Shingles) Vaccine  Completed   Hepatitis C Screening  Addressed   Meningitis B Vaccine  Aged Out     Advance Care Planning is important because it: Ensures you receive medical care that aligns with your values, goals, and preferences. Provides guidance to your family and loved ones, reducing the emotional burden of decision-making during critical moments.  Vision: Annual vision screenings are recommended  for early detection of glaucoma, cataracts, and diabetic retinopathy. These exams can also reveal signs of chronic conditions such as diabetes and high blood pressure.  Dental: Annual dental screenings help detect early signs of oral cancer, gum disease, and other conditions linked to overall health, including heart disease and diabetes.  Please see the attached documents for additional preventive care recommendations.   NEXT AWV 08/14/25 @ 4:00 PM BY VIDEO

## 2024-11-26 ENCOUNTER — Ambulatory Visit: Admitting: Student

## 2025-08-14 ENCOUNTER — Ambulatory Visit
